# Patient Record
Sex: Female | Born: 1980 | Race: White | Hispanic: No | Smoking: Never smoker
Health system: Southern US, Community
[De-identification: ages and names within clinical notes are randomized; demographics above are authoritative.]

## PROBLEM LIST (undated history)

## (undated) DIAGNOSIS — F32A Depression, unspecified: Secondary | ICD-10-CM

## (undated) DIAGNOSIS — F329 Major depressive disorder, single episode, unspecified: Secondary | ICD-10-CM

## (undated) DIAGNOSIS — K219 Gastro-esophageal reflux disease without esophagitis: Secondary | ICD-10-CM

## (undated) DIAGNOSIS — Z801 Family history of malignant neoplasm of trachea, bronchus and lung: Secondary | ICD-10-CM

## (undated) DIAGNOSIS — Z803 Family history of malignant neoplasm of breast: Secondary | ICD-10-CM

## (undated) DIAGNOSIS — R87619 Unspecified abnormal cytological findings in specimens from cervix uteri: Secondary | ICD-10-CM

## (undated) DIAGNOSIS — F419 Anxiety disorder, unspecified: Secondary | ICD-10-CM

## (undated) DIAGNOSIS — Z806 Family history of leukemia: Secondary | ICD-10-CM

## (undated) DIAGNOSIS — Z8042 Family history of malignant neoplasm of prostate: Secondary | ICD-10-CM

## (undated) DIAGNOSIS — Z800 Family history of malignant neoplasm of digestive organs: Secondary | ICD-10-CM

## (undated) DIAGNOSIS — Z8481 Family history of carrier of genetic disease: Secondary | ICD-10-CM

## (undated) HISTORY — DX: Family history of malignant neoplasm of breast: Z80.3

## (undated) HISTORY — DX: Family history of leukemia: Z80.6

## (undated) HISTORY — DX: Depression, unspecified: F32.A

## (undated) HISTORY — DX: Family history of malignant neoplasm of trachea, bronchus and lung: Z80.1

## (undated) HISTORY — DX: Family history of malignant neoplasm of prostate: Z80.42

## (undated) HISTORY — PX: ABDOMINAL HYSTERECTOMY: SHX81

## (undated) HISTORY — DX: Gastro-esophageal reflux disease without esophagitis: K21.9

## (undated) HISTORY — DX: Major depressive disorder, single episode, unspecified: F32.9

## (undated) HISTORY — DX: Family history of malignant neoplasm of digestive organs: Z80.0

## (undated) HISTORY — DX: Unspecified abnormal cytological findings in specimens from cervix uteri: R87.619

## (undated) HISTORY — DX: Family history of carrier of genetic disease: Z84.81

---
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
## 2000-11-05 ENCOUNTER — Other Ambulatory Visit: Admission: RE | Admit: 2000-11-05 | Discharge: 2000-11-05 | Payer: Self-pay | Admitting: Family Medicine

## 2001-04-29 ENCOUNTER — Encounter: Admission: RE | Admit: 2001-04-29 | Discharge: 2001-04-29 | Payer: Self-pay | Admitting: Family Medicine

## 2001-04-29 ENCOUNTER — Encounter: Payer: Self-pay | Admitting: Family Medicine

## 2002-01-25 ENCOUNTER — Other Ambulatory Visit: Admission: RE | Admit: 2002-01-25 | Discharge: 2002-01-25 | Payer: Self-pay | Admitting: Family Medicine

## 2002-11-09 ENCOUNTER — Other Ambulatory Visit: Admission: RE | Admit: 2002-11-09 | Discharge: 2002-11-09 | Payer: Self-pay | Admitting: Obstetrics and Gynecology

## 2003-06-21 ENCOUNTER — Inpatient Hospital Stay (HOSPITAL_COMMUNITY): Admission: AD | Admit: 2003-06-21 | Discharge: 2003-06-24 | Payer: Self-pay | Admitting: Obstetrics and Gynecology

## 2003-06-23 ENCOUNTER — Encounter: Payer: Self-pay | Admitting: Obstetrics and Gynecology

## 2003-09-29 ENCOUNTER — Other Ambulatory Visit: Admission: RE | Admit: 2003-09-29 | Discharge: 2003-09-29 | Payer: Self-pay | Admitting: Obstetrics and Gynecology

## 2004-03-07 ENCOUNTER — Other Ambulatory Visit: Admission: RE | Admit: 2004-03-07 | Discharge: 2004-03-07 | Payer: Self-pay | Admitting: Obstetrics and Gynecology

## 2004-06-26 ENCOUNTER — Other Ambulatory Visit: Admission: RE | Admit: 2004-06-26 | Discharge: 2004-06-26 | Payer: Self-pay | Admitting: Obstetrics and Gynecology

## 2004-08-12 ENCOUNTER — Inpatient Hospital Stay (HOSPITAL_COMMUNITY): Admission: AD | Admit: 2004-08-12 | Discharge: 2004-08-12 | Payer: Self-pay | Admitting: Obstetrics & Gynecology

## 2004-09-26 ENCOUNTER — Other Ambulatory Visit: Admission: RE | Admit: 2004-09-26 | Discharge: 2004-09-26 | Payer: Self-pay | Admitting: Obstetrics and Gynecology

## 2004-10-10 ENCOUNTER — Inpatient Hospital Stay (HOSPITAL_COMMUNITY): Admission: AD | Admit: 2004-10-10 | Discharge: 2004-10-10 | Payer: Self-pay | Admitting: Obstetrics and Gynecology

## 2005-01-17 ENCOUNTER — Inpatient Hospital Stay (HOSPITAL_COMMUNITY): Admission: AD | Admit: 2005-01-17 | Discharge: 2005-01-17 | Payer: Self-pay | Admitting: Obstetrics and Gynecology

## 2005-04-03 ENCOUNTER — Inpatient Hospital Stay (HOSPITAL_COMMUNITY): Admission: AD | Admit: 2005-04-03 | Discharge: 2005-04-05 | Payer: Self-pay | Admitting: Obstetrics and Gynecology

## 2005-05-21 ENCOUNTER — Other Ambulatory Visit: Admission: RE | Admit: 2005-05-21 | Discharge: 2005-05-21 | Payer: Self-pay | Admitting: Obstetrics and Gynecology

## 2005-11-13 ENCOUNTER — Other Ambulatory Visit: Admission: RE | Admit: 2005-11-13 | Discharge: 2005-11-13 | Payer: Self-pay | Admitting: Obstetrics and Gynecology

## 2006-09-23 HISTORY — PX: TUBAL LIGATION: SHX77

## 2007-06-30 ENCOUNTER — Ambulatory Visit (HOSPITAL_COMMUNITY): Admission: RE | Admit: 2007-06-30 | Discharge: 2007-06-30 | Payer: Self-pay | Admitting: Obstetrics and Gynecology

## 2007-07-17 ENCOUNTER — Inpatient Hospital Stay (HOSPITAL_COMMUNITY): Admission: AD | Admit: 2007-07-17 | Discharge: 2007-07-17 | Payer: Self-pay | Admitting: Obstetrics and Gynecology

## 2007-08-27 ENCOUNTER — Inpatient Hospital Stay (HOSPITAL_COMMUNITY): Admission: AD | Admit: 2007-08-27 | Discharge: 2007-08-27 | Payer: Self-pay | Admitting: Obstetrics and Gynecology

## 2007-08-31 ENCOUNTER — Inpatient Hospital Stay (HOSPITAL_COMMUNITY): Admission: AD | Admit: 2007-08-31 | Discharge: 2007-09-03 | Payer: Self-pay | Admitting: Obstetrics and Gynecology

## 2007-09-01 ENCOUNTER — Encounter (INDEPENDENT_AMBULATORY_CARE_PROVIDER_SITE_OTHER): Payer: Self-pay | Admitting: Obstetrics and Gynecology

## 2007-09-24 LAB — CONVERTED CEMR LAB: Pap Smear: NORMAL

## 2008-04-15 ENCOUNTER — Ambulatory Visit: Payer: Self-pay | Admitting: Family Medicine

## 2008-04-15 ENCOUNTER — Telehealth: Payer: Self-pay | Admitting: Family Medicine

## 2008-04-15 DIAGNOSIS — J45909 Unspecified asthma, uncomplicated: Secondary | ICD-10-CM | POA: Insufficient documentation

## 2008-04-15 DIAGNOSIS — G43009 Migraine without aura, not intractable, without status migrainosus: Secondary | ICD-10-CM | POA: Insufficient documentation

## 2008-04-15 DIAGNOSIS — F329 Major depressive disorder, single episode, unspecified: Secondary | ICD-10-CM

## 2008-04-22 ENCOUNTER — Ambulatory Visit: Payer: Self-pay | Admitting: Family Medicine

## 2008-04-22 DIAGNOSIS — H6590 Unspecified nonsuppurative otitis media, unspecified ear: Secondary | ICD-10-CM | POA: Insufficient documentation

## 2008-06-22 ENCOUNTER — Ambulatory Visit: Payer: Self-pay | Admitting: Family Medicine

## 2009-03-13 ENCOUNTER — Ambulatory Visit: Payer: Self-pay | Admitting: Family Medicine

## 2009-03-23 ENCOUNTER — Ambulatory Visit: Payer: Self-pay | Admitting: Family Medicine

## 2009-03-23 DIAGNOSIS — R5383 Other fatigue: Secondary | ICD-10-CM

## 2009-03-23 DIAGNOSIS — R5381 Other malaise: Secondary | ICD-10-CM

## 2009-04-03 ENCOUNTER — Ambulatory Visit: Payer: Self-pay | Admitting: Family Medicine

## 2009-04-05 LAB — CONVERTED CEMR LAB
ALT: 15 units/L (ref 0–35)
AST: 15 units/L (ref 0–37)
Albumin: 4.4 g/dL (ref 3.5–5.2)
BUN: 15 mg/dL (ref 6–23)
Basophils Absolute: 0 10*3/uL (ref 0.0–0.1)
CO2: 29 meq/L (ref 19–32)
Chloride: 107 meq/L (ref 96–112)
Cholesterol: 193 mg/dL (ref 0–200)
Eosinophils Relative: 2.1 % (ref 0.0–5.0)
Glucose, Bld: 82 mg/dL (ref 70–99)
INR: 1 (ref 0.8–1.0)
Monocytes Relative: 7.4 % (ref 3.0–12.0)
Neutrophils Relative %: 56.2 % (ref 43.0–77.0)
Platelets: 230 10*3/uL (ref 150.0–400.0)
Potassium: 4.6 meq/L (ref 3.5–5.1)
RDW: 12.1 % (ref 11.5–14.6)
TSH: 0.95 microintl units/mL (ref 0.35–5.50)
Total Protein: 7.5 g/dL (ref 6.0–8.3)
VLDL: 14 mg/dL (ref 0.0–40.0)
Vitamin B-12: 269 pg/mL (ref 211–911)
WBC: 5.5 10*3/uL (ref 4.5–10.5)
aPTT: 31.4 s — ABNORMAL HIGH (ref 21.7–28.8)

## 2009-05-17 ENCOUNTER — Ambulatory Visit: Payer: Self-pay | Admitting: Family Medicine

## 2009-05-17 DIAGNOSIS — M533 Sacrococcygeal disorders, not elsewhere classified: Secondary | ICD-10-CM | POA: Insufficient documentation

## 2009-05-17 DIAGNOSIS — M76899 Other specified enthesopathies of unspecified lower limb, excluding foot: Secondary | ICD-10-CM | POA: Insufficient documentation

## 2009-05-17 DIAGNOSIS — M545 Low back pain: Secondary | ICD-10-CM

## 2009-06-08 ENCOUNTER — Telehealth: Payer: Self-pay | Admitting: Family Medicine

## 2009-07-25 ENCOUNTER — Telehealth: Payer: Self-pay | Admitting: Family Medicine

## 2009-07-27 ENCOUNTER — Ambulatory Visit: Payer: Self-pay | Admitting: Family Medicine

## 2009-07-27 ENCOUNTER — Emergency Department: Payer: Self-pay | Admitting: Emergency Medicine

## 2009-12-19 ENCOUNTER — Ambulatory Visit: Payer: Self-pay | Admitting: Gastroenterology

## 2010-01-18 ENCOUNTER — Ambulatory Visit: Payer: Self-pay | Admitting: Oncology

## 2010-03-29 ENCOUNTER — Ambulatory Visit: Payer: Self-pay | Admitting: Oncology

## 2011-02-05 NOTE — Op Note (Signed)
Karen Kramer, Karen Kramer              ACCOUNT NO.:  [id number]   MEDICAL RECORD NO.:  [id number]          PATIENT TYPE:  INP   LOCATION:  9115                          FACILITY:  [facility]   PHYSICIAN:  Guy Sandifer. Henderson Cloud, M.D. DATE OF BIRTH:  [birth year]   DATE OF PROCEDURE:  09/01/2007  DATE OF DISCHARGE:                               OPERATIVE REPORT   PREOPERATIVE DIAGNOSIS:  Desires permanent sterilization.   POSTOPERATIVE DIAGNOSIS:  Desires permanent sterilization.   PROCEDURE:  Postpartum bilateral tubal ligation.   SURGEON:  Guy Sandifer. Henderson Cloud, M.D.   ANESTHESIA:  General with endotracheal intubation.   ESTIMATED BLOOD LOSS:  Drops.   INDICATIONS AND CONSENT:  This patient is a 30 year old married, white  female G2 P2 status post delivery on August 31, 2007.  She desires  permanent sterilization.  Preoperatively the procedure is discussed.  The permanence is stressed to the patient.  The failure rate, and  increased ectopic risks were reviewed.  The potential risks are reviewed  including, but not limited to infection, organ damage, bleeding  requiring transfusion of blood products, possible HIV, and hepatitis  acquisition, DVT, PE, and pneumonia.  All questions were answered, and  consent is signed on the chart.   DESCRIPTION OF PROCEDURE:  The patient was taken to the operating room  where she was identified and placed in the  dorsal supine position, and  general anesthesia was induced given endotracheal intubation.  She was  then prepped and draped in a sterile fashion.  An infraumbilical  incision was made and dissection was carried out in layers to the  peritoneum which was bluntly entered.   The left fallopian tube has been identified from cornu to fimbria.  It  is grasped in its midportion with the Babcock clamped.  A knuckle of the  tube is doubly ligated with two free ties of #0 plain suture.  The  intervening knuckle of tissue was sharply resected.  Cautery was used  to  help assure complete hemostasis.  Inspection reveals no bleeding.   The similar procedure was then carried out on the right fallopian tube.  Again, good hemostasis was noted.  The fascia was closed in a running  fashion with #0 Vicryl suture.  The skin was closed with a subcuticular  4-0 Vicryl suture.  Dermabond was applied.  All counts were correct.  The patient was awakened, taken to the recovery room in stable  condition.     Guy Sandifer Henderson Cloud, M.D.  Electronically Signed    JET/MEDQ  D:  09/01/2007  T:  09/01/2007  Job:  [id number]

## 2011-02-08 NOTE — Discharge Summary (Signed)
   NAMEJENAVI, Karen Kramer                          ACCOUNT NO.:  [id number]   MEDICAL RECORD NO.:  [id number]                   PATIENT TYPE:  INP   LOCATION:  9326                                 [facility]ACILITY:  [facility]   PHYSICIAN:  Michelle L. Vincente Poli, M.D.            DATE OF BIRTH:  11/04/1980   DATE OF ADMISSION:  06/21/2003  DATE OF DISCHARGE:  06/24/2003                                 DISCHARGE SUMMARY   ADMISSION DIAGNOSIS:  Pelvic inflammatory disease.   DISCHARGE DIAGNOSIS:  Pelvic inflammatory disease.   HOSPITAL COURSE:  The patient is a 30 year old female who was admitted with  lower abdominal pain and yellow discharge.  She was treated with two courses  of outpatient antibiotics without relief.  On admission, the patient was  noted to have a normal white blood cell count of 6.1, and a hemoglobin of  13.3.  She was placed on ampicillin, gentamicin, and clindamycin, and by  hospital day #4, her pain is almost resolved, and she is feeling much  better.  She is discharged home.   DISCHARGE MEDICATIONS:  1. Doxycycline 100 mg b.i.d. for 14 days.  2. Ibuprofen.   DISCHARGE INSTRUCTIONS:  She is advised to call if she has any temperature  greater than 100.5, any severe abdominal pain, worsening discharge, or heavy  vaginal bleeding.   FOLLOW UP:  She will follow up in the office in three weeks.                                               Michelle L. Vincente Poli, M.D.    Karen Kramer  D:  07/12/2003  T:  07/12/2003  Job:  [id number]

## 2011-05-20 ENCOUNTER — Emergency Department: Payer: Self-pay | Admitting: Emergency Medicine

## 2011-05-22 ENCOUNTER — Encounter: Payer: Self-pay | Admitting: Family Medicine

## 2011-05-23 ENCOUNTER — Other Ambulatory Visit: Payer: Self-pay | Admitting: Obstetrics and Gynecology

## 2011-05-24 ENCOUNTER — Ambulatory Visit: Payer: Self-pay | Admitting: Family Medicine

## 2011-05-28 ENCOUNTER — Ambulatory Visit (INDEPENDENT_AMBULATORY_CARE_PROVIDER_SITE_OTHER): Payer: BC Managed Care – PPO | Admitting: Family Medicine

## 2011-05-28 ENCOUNTER — Encounter: Payer: Self-pay | Admitting: Family Medicine

## 2011-05-28 ENCOUNTER — Encounter: Payer: Self-pay | Admitting: Neurology

## 2011-05-28 DIAGNOSIS — R202 Paresthesia of skin: Secondary | ICD-10-CM | POA: Insufficient documentation

## 2011-05-28 DIAGNOSIS — R209 Unspecified disturbances of skin sensation: Secondary | ICD-10-CM

## 2011-05-28 DIAGNOSIS — R51 Headache: Secondary | ICD-10-CM

## 2011-05-28 DIAGNOSIS — R55 Syncope and collapse: Secondary | ICD-10-CM

## 2011-05-28 DIAGNOSIS — R519 Headache, unspecified: Secondary | ICD-10-CM | POA: Insufficient documentation

## 2011-05-28 NOTE — Assessment & Plan Note (Signed)
Assoc with syncope and poss seizure  Not her regular migraine - ? Atypical  Nl lab and CT from [facility]

## 2011-05-28 NOTE — Assessment & Plan Note (Signed)
Intermittent all over body with loc and poss seizure on sat  Nl w/u ER [facility]  Worse on L today with some pronator drift Ref to neurology

## 2011-05-28 NOTE — Assessment & Plan Note (Signed)
Assoc with prodrome of tingling all over  Went to [facility] - nl ct and labs  Ref to neurol  Has fam hx of seizure disorder inst not to drive Feels ok today Some L pronator drift on exam however

## 2011-05-28 NOTE — Progress Notes (Signed)
Subjective:    Patient ID: Karen Kramer, female    DOB: 1981-05-06, 30 y.o.   MRN: [id number]  HPI Here for tingling all over her body Symptoms started last Saturday and have been on and off  Felt like warm tingly sensations from her body and then got light headed and fell to the floor Lost consciousness 15-20 seconds  Never happened before  Friend noticed seizure activity - did not tell her what she did  No new medicines   Went to [facility] on Monday- and CT was normal , and ekg was normal  ? If they drew her blood    No hx of seizure disorder in the past --but has a family hx   Same symptoms Sunday and yesterday  Could lie down when she felt dizzy - so she did not pass out  Headache started this last week Is all over her head -- pounding -- light and heavy feeling  Does not feel like her regular migraine   Right now - just her L arm and back of neck  Not anxious  Stress is the same as usual   Lost 14 lb from last visit-- is trying hard to loose weight  Eating less- cutting portions in 1/2- not extreme about it  Not exercising  Eating does not resolve symptoms   Patient Active Problem List  Diagnoses  . DEPRESSION  . MIGRAINE, COMMON  . ASTHMA, PERSISTENT, MODERATE  . LOW BACK PAIN, CHRONIC  . SACROILIAC JOINT DYSFUNCTION  . TROCHANTERIC BURSITIS, RIGHT  . FATIGUE  . Tingling of skin  . Headache  . Syncope   Past Medical History  Diagnosis Date  . Asthma   . Depression   . Atypical cervical glandular cells    Past Surgical History  Procedure Date  . Tubal ligation 2008   History  Substance Use Topics  . Smoking status: Never Smoker   . Smokeless tobacco: Not on file  . Alcohol Use: No   Family History  Problem Relation Age of Onset  . Cancer Mother     pancreatic  . Lung disease Father   . Cancer Maternal Grandfather     prostate  . Cancer Paternal Grandfather     prostate  . Seizures Father 35   No Known Allergies Current Outpatient  Prescriptions on File Prior to Visit  Medication Sig Dispense Refill  . citalopram (CELEXA) 40 MG tablet Take 40 mg by mouth daily.        . diclofenac (VOLTAREN) 75 MG EC tablet Take 75 mg by mouth 2 (two) times daily.        . mometasone (NASONEX) 50 MCG/ACT nasal spray Place 2 sprays into the nose daily.        . Multiple Vitamin (MULTIVITAMIN) tablet Take 1 tablet by mouth daily.              Review of Systems Review of Systems  Constitutional: Negative for fever, appetite change, fatigue and unexpected weight change.  Eyes: Negative for pain and visual disturbance.  Respiratory: Negative for cough and shortness of breath.   Cardiovascular: Negative for cp or palpitations    Gastrointestinal: Negative for nausea, diarrhea and constipation.  Genitourinary: Negative for urgency and frequency.  Skin: Negative for pallor or rash   Neurological: Negative for weakness, , tremor, ataxia , pos for headache/ paresthesia  Hematological: Negative for adenopathy. Does not bruise/bleed easily.  Psychiatric/Behavioral: Negative for dysphoric mood. The patient is not nervous/anxious.  Objective:   Physical Exam  Constitutional: She is oriented to person, place, and time. She appears well-developed and well-nourished. No distress.  HENT:  Head: Normocephalic and atraumatic.  Right Ear: External ear normal.  Left Ear: External ear normal.  Nose: Nose normal.  Mouth/Throat: Oropharynx is clear and moist.       No sinus tenderness No temporal tenderness  Eyes: Conjunctivae and EOM are normal. Pupils are equal, round, and reactive to light.  Neck: Normal range of motion. Neck supple. No JVD present. Carotid bruit is not present. No thyromegaly present.  Cardiovascular: Normal rate, regular rhythm, normal heart sounds and intact distal pulses.   Pulmonary/Chest: Effort normal and breath sounds normal. No respiratory distress. She exhibits no tenderness.  Abdominal: Soft. Bowel sounds  are normal. She exhibits no distension and no mass. There is no tenderness.  Musculoskeletal: Normal range of motion. She exhibits no edema and no tenderness.  Lymphadenopathy:    She has no cervical adenopathy.  Neurological: She is alert and oriented to person, place, and time. She has normal strength and normal reflexes. She displays no atrophy and no tremor. A sensory deficit is present. No cranial nerve deficit. She exhibits normal muscle tone. She displays a negative Romberg sign. She displays no seizure activity. Coordination and gait normal.       Mild pronator drift noted on the left  Both hands - has decreased sensation on fingers and forearms (with monofilament) Normal temp and proprioception   Skin: Skin is warm and dry.  Psychiatric: She has a normal mood and affect.          Assessment & Plan:

## 2011-05-28 NOTE — Patient Instructions (Signed)
  Refer to neurology asap please  I do not want you to drive until you get checked out  Stay out of work until further evaluation please  Eat regular meals and drink fluids

## 2011-05-29 ENCOUNTER — Encounter: Payer: Self-pay | Admitting: Neurology

## 2011-05-29 ENCOUNTER — Ambulatory Visit (INDEPENDENT_AMBULATORY_CARE_PROVIDER_SITE_OTHER): Payer: BC Managed Care – PPO | Admitting: Neurology

## 2011-05-29 DIAGNOSIS — G43909 Migraine, unspecified, not intractable, without status migrainosus: Secondary | ICD-10-CM

## 2011-05-29 DIAGNOSIS — G43109 Migraine with aura, not intractable, without status migrainosus: Secondary | ICD-10-CM

## 2011-05-29 NOTE — Progress Notes (Signed)
Dr. Ermalene Searing,  Thank you for having me see Karen Kramer in consultation today for her problems with recent spell of loss of consciousness.  As you may recall she is a 30 year old right hand dominant woman with a history of migraine headaches who approximately 2 weeks ago began to develop a headache and felt faint. Associated with this was a feeling of warm tingling. She did not feel diaphoretic. She was noted to fall and was out for approximately 15-20 seconds. She may have had some shaking movements during this time period. It took her several minutes to get back to normal. She did not have any bowel or bladder loss or tongue biting.  Afterwards she had significant headache. Since that time she's been having frequent headaches almost every day. These are described as having a "funny feeling" in the head. At times they are throbbing. She is also having bilateral numbness and tingling at times. Many times this seems to evolve into just involving the left side near the end of headache.  Has not been taking any medication for these headaches. She says she feels weak on the left side as well.  Past medical history significant for migraine headaches since age 10. She  describes them as bilateral throbbing in nature sometimes with accompanying nausea. They occur about once per month but do not clearly have a relationship to her period.  She has had scotoma with headaches but no other neurologic symptoms. She also suffers from depression and anxiety. She also has chronic hip bursitis.  Social history: she is a single mother. 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 She has 2 children.  Family history: Both her brother and sister have migraine headaches. This is the same for mother and father.  Review of systems: 14 systems were reviewed and otherwise unremarkable problems with anxiety and depression. However this has not changed recently.  Exam: Filed Vitals:   05/29/11 1128  BP: 108/72  Pulse:  76   Filed Vitals:   05/29/11 1128  Height: 5' 3.5" (1.613 m)  Weight: 128 lb (58.06 kg)   General she is a well-appearing young woman in no acute distress. She is oriented x4. She is appropriate affect. He is neither  circumferential or tangential. Fundi are benign with spontaneous venous pulsations bilaterally. Cardiovascular history reveals no carotid bruits, regular rate and rhythm.  Neurologically pupils are round and react to light visual fields full to confrontation. Extraocular movements without nystagmus and her full. Facial sensation decreased on the left face. Muscles of facial expression are symmetric. Tongue uvula and palate midline. Hearing is bilaterally intact. Shoulder shrug bilaterally intact. Jaw strength and bulk is normal.  Motor exam reveals normal bulk tone. She is a functional pronator drift on the left. There is no orbiting or impairment of fine finger movements however. She has functional giveaway weakness on the left.  Reflexes are symmetric and toes downgoing. Sensory testing reveals decreased temperature and vibration sense on the left hemibody.  Coordination reveals normal finger to nose testing. Casual gait and station are normal. Romberg is negative. Tandem gait is intact.  Recent CT of the head was reviewed by myself and was unremarkable.  Impression: Karen Kramer is a 30 year old female with history of migraine headaches does have 1 loss of consciousness in the setting of a new type of headache. These headaches have continued almost daily since the initial event 2 weeks ago. My feeling is likely that a loss of consciousness was syncopal in nature  and likely a vasovagal response associated with the headache or possibly a basilar variant of migraine.  It is very common in syncopal events to have short motor convulsions.  I also think that her current waxing and waning  sensory symptoms are likely a prolonged aura associated with her ongoing headaches.  She also  has functional overlay which may be due to her difficulties with anxiety.     However I am going to proceed with getting an MRI of the brain with and without contrast. Given the new nature the headache it is possible that this represents a more serious process. However I do think this is unlikely. I am also going to get an MRA of her head in case there is evidence of vasoconstriction.  I informed her that it is okay to return to work. She unfortunately should not drive. She'll need to go at least several months spell free before she gets back behind the wheel.  She is to avoid taking baths as well as climbing heights.  I will see her back in clinic after her imaging. I instructed her to take naproxen 400 mg twice a day to see if we compared his recent exacerbation of her headaches. If this does not work I would consider a steroid taper.  Thank you for having me see this nice woman in consultation.  If you have any questions please feel free to contact me.  Lupita Raider Modesto Charon, MD [facility], [place]    - couple weekends ago - funny headache feel - felt faint - 15 - 20 seconds - few minutes, muffled - warm tingling feeling - headache - tingling in the face - nothing changed - bladder loss - not feeling sweaty - no biting of tongue  - headaches  - inside of the head - numbness in the head - throbbing headache  - bad headache - throbbing, photophobia - bitemporal in past   - stress and axnxiety - hip bursitis  - migraines in family, both brother and sisters, father - flashing lights with your headaches  Social history:    Allergies: NKDA   -  Exam: - normal cranial nerves - functional left sided drift - normal orbiting, Normal fine finger - decreased sensation temperature and vibration on left side.  - mri brain with and without - MRA head

## 2011-05-29 NOTE — Patient Instructions (Addendum)
You have been scheduled for a MRI and MRA on Tuesday, Sept, 11th at 9:00am.  Please arrive by 8:45am to [facility].  Please get Aleve OTC and take two pill twice a day for two weeks.

## 2011-06-04 ENCOUNTER — Inpatient Hospital Stay (HOSPITAL_COMMUNITY): Admission: RE | Admit: 2011-06-04 | Payer: BC Managed Care – PPO | Source: Ambulatory Visit

## 2011-06-05 ENCOUNTER — Other Ambulatory Visit: Payer: Self-pay | Admitting: Obstetrics and Gynecology

## 2011-06-14 ENCOUNTER — Ambulatory Visit: Payer: Self-pay | Admitting: Family Medicine

## 2011-06-14 ENCOUNTER — Encounter: Payer: Self-pay | Admitting: Family Medicine

## 2011-06-21 ENCOUNTER — Ambulatory Visit: Payer: BC Managed Care – PPO | Admitting: Neurology

## 2011-07-01 LAB — CBC
MCHC: 34.8
Platelets: 156
RBC: 3.67 — ABNORMAL LOW
RDW: 15.1
WBC: 14.3 — ABNORMAL HIGH

## 2011-07-01 LAB — URINALYSIS, ROUTINE W REFLEX MICROSCOPIC
Bilirubin Urine: NEGATIVE
Hgb urine dipstick: NEGATIVE
Ketones, ur: 15 — AB
Nitrite: NEGATIVE
pH: 6

## 2011-07-01 LAB — RPR: RPR Ser Ql: NONREACTIVE

## 2011-07-01 LAB — RH IMMUNE GLOB WKUP(>/=20WKS)(NOT WOMEN'S HOSP): Fetal Screen: NEGATIVE

## 2011-07-01 LAB — URINE MICROSCOPIC-ADD ON

## 2011-07-03 LAB — URINALYSIS, ROUTINE W REFLEX MICROSCOPIC
Bilirubin Urine: NEGATIVE
Glucose, UA: NEGATIVE
Hgb urine dipstick: NEGATIVE
Ketones, ur: 40 — AB
Protein, ur: NEGATIVE

## 2011-07-04 LAB — RH IMMUNE GLOBULIN WORKUP (NOT WOMEN'S HOSP)

## 2011-07-04 LAB — GLUCOSE TOLERANCE, 1 HOUR: Glucose, 1 Hour GTT: 132

## 2011-10-21 ENCOUNTER — Ambulatory Visit: Payer: BC Managed Care – PPO | Admitting: Family Medicine

## 2011-10-25 ENCOUNTER — Ambulatory Visit: Payer: BC Managed Care – PPO | Admitting: Family Medicine

## 2011-11-20 ENCOUNTER — Ambulatory Visit: Payer: BC Managed Care – PPO | Admitting: Family Medicine

## 2011-11-22 ENCOUNTER — Ambulatory Visit (INDEPENDENT_AMBULATORY_CARE_PROVIDER_SITE_OTHER): Payer: BC Managed Care – PPO | Admitting: Family Medicine

## 2011-11-22 ENCOUNTER — Encounter: Payer: Self-pay | Admitting: Family Medicine

## 2011-11-22 VITALS — BP 100/60 | HR 76 | Temp 98.7°F | Ht 63.5 in | Wt 139.2 lb

## 2011-11-22 DIAGNOSIS — M25562 Pain in left knee: Secondary | ICD-10-CM

## 2011-11-22 DIAGNOSIS — M25569 Pain in unspecified knee: Secondary | ICD-10-CM

## 2011-11-22 DIAGNOSIS — M76899 Other specified enthesopathies of unspecified lower limb, excluding foot: Secondary | ICD-10-CM

## 2011-11-22 MED ORDER — HYDROCODONE-ACETAMINOPHEN 5-500 MG PO TABS: 1.0000 | ORAL_TABLET | Freq: Three times a day (TID) | ORAL | Status: AC | PRN
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
## 2011-11-22 NOTE — Assessment & Plan Note (Signed)
Intermittent for a while - ? 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

## 2011-11-22 NOTE — Patient Instructions (Signed)
I think you may have an overuse issue in knee - ? Tendonitis , less likely arthritis  I think you have bursitis in hip Since you cannot take nsaids - use tylenol for pain If pain is severe- try the vicodin- but do not mix with tylenol  Use intermittent ice/ heat on hip (10 min on and 10 min off) Make appt with Dr Patsy Lager at check out  Xray is not available today

## 2011-11-22 NOTE — Assessment & Plan Note (Signed)
This is recurrent - and in past did not fully respond to nsaid - and now cannot take them  I suspect she may need an injection  Will ref for eval by Dr Patsy Lager Has vicodin for severe pain only

## 2011-12-09 ENCOUNTER — Encounter: Payer: Self-pay | Admitting: Family Medicine

## 2011-12-09 ENCOUNTER — Ambulatory Visit (INDEPENDENT_AMBULATORY_CARE_PROVIDER_SITE_OTHER): Payer: BC Managed Care – PPO | Admitting: Family Medicine

## 2011-12-09 VITALS — BP 110/72 | HR 70 | Temp 98.7°F | Ht 63.5 in | Wt 140.8 lb

## 2011-12-09 DIAGNOSIS — M7650 Patellar tendinitis, unspecified knee: Secondary | ICD-10-CM

## 2011-12-09 DIAGNOSIS — M7652 Patellar tendinitis, left knee: Secondary | ICD-10-CM

## 2011-12-09 DIAGNOSIS — M7061 Trochanteric bursitis, right hip: Secondary | ICD-10-CM

## 2011-12-09 DIAGNOSIS — M76899 Other specified enthesopathies of unspecified lower limb, excluding foot: Secondary | ICD-10-CM

## 2011-12-09 NOTE — Progress Notes (Signed)
Patient Name: Karen Kramer Date of Birth: 1981-09-13 Age: 31 y.o. Medical Record Number: [id number] Gender: female Date of Encounter: 12/09/2011  History of Present Illness:  Karen Kramer is a 31 y.o. very pleasant female patient who presents with the following:  R hip bursitis has flared up and not able to really sit up and it will bother her and will start to limp Fair pleasant patient who has had some right trochanteric bursitis on the right without any prior trauma or injury. The pain is almost all lateral leg directly at the GTB. She denies any groin pain. She has no significant back pain. No radicular pain. No numbness or tingling. 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 She has not done any home rehabilitation and, does not have any sort of routine activity that she takes part in.  But now has some pain L knee pain. Localized over the patellar tendon. No trauma or accident. No bruising or swelling. No history of internal derangement. She does have a history of a remote injury that she can recall as a child. She wore some sort of leg immobilizer, but is unclear the exact injury. She denies an occult fracture. No locking up. No giving way. Anterior and posterior.  Works at a daycare, infant.  Past Medical History, Surgical History, Social History, Family History, Problem List, Medications, and Allergies have been reviewed and updated if relevant.  Review of Systems:  GEN: No fevers, chills. Nontoxic. Primarily MSK c/o today. MSK: Detailed in the HPI GI: tolerating PO intake without difficulty Neuro: No numbness, parasthesias, or tingling associated. Otherwise the pertinent positives of the ROS are noted above.    Physical Examination: Filed Vitals:   12/09/11 1144  BP: 110/72  Pulse: 70  Temp: 98.7 F (37.1 C)  TempSrc: Oral  Height: 5' 3.5" (1.613 m)  Weight: 140 lb 12.8 oz (63.866 kg)  SpO2: 99%    Body mass index is  24.55 kg/(m^2).   GEN: WDWN, NAD, Non-toxic, Alert & Oriented x 3 HEENT: Atraumatic, Normocephalic.  Ears and Nose: No external deformity. EXTR: No clubbing/cyanosis/edema NEURO: Normal gait.  PSYCH: Normally interactive. Conversant. Not depressed or anxious appearing.  Calm demeanor.   HIP EXAM: SIDE: R ROM: Abduction, Flexion, Internal and External range of motion: full Pain with terminal IROM and EROM: no GTB: marked TTP SLR: NEG Knees: No effusion FABER: NT REVERSE FABER: NT, neg Piriformis: NT at direct palpation Str: flexion: 5/5 abduction: 5/5 adduction: 4-/5  Left knee: Full extension. Full flexion. No effusion. Minimal tender with loading the patellar facets. Stable to varus and valgus stress. Negative Lachman. Negative but Murray's. Negative bounce home maneuver. Notably tender at the patellar tendon. Mild posterior tenderness and fullness.    Assessment and Plan:  1. Trochanteric bursitis of right hip   2. Patellar tendinitis of left knee     I reviewed with the patient the structures involved and how they related to diagnosis.   Patient was given a rehabilitation protocol emphasizing core rehab, partcularly addressing abductor weakness, and stretching of the pelvic musculature, hips, and ITB.  Trochanteric bursa injections have clearly been shown to help with acute bursitis, and the patient would benefit.  I suspect that the patellar tendinopathy is likely secondary. I reviewed some rehabilitation with her including some acentric lowers. She is also going to use a Cho-pat type strap and I have given her in the office  Trochanteric Bursitis Injection, R Verbal consent  obtained. Risks (including infection, potential atrophy), benefits, and alternatives reviewed. Greater trochanter sterilely prepped with Chloraprep. Ethyl Chloride used for anesthesia. 8 cc of Lidocaine 1% injected with 2 cc of 40 mg Depo-Medrol into trochanteric bursa at area of maximal tenderness at  greater trochanter. Needle taken to bone to troch bursa, flows easily. Bursa massaged. No bleeding and no complications. Decreased pain after injection. Needle: 22 gauge

## 2011-12-09 NOTE — Patient Instructions (Signed)
Trochanteric Bursa Rehab  Stretches: Cross Leg, "sink stretch" - can do all day during work day Cross over stretch, laying down, hold 5-10 sec x 3  Hip Abduction - build to 3 sets of 30 and then add weights begin with no weight. When 3 x 30 reached, Add 2 lb. ankle weight. Increase to 3,4,5,6 when 3x30 achieved.  Knee up and open hip: knee up and externally rotate hip to open position, hold 2 sec and repeat each leg, 30 reps   

## 2012-03-09 ENCOUNTER — Ambulatory Visit: Payer: BC Managed Care – PPO | Admitting: Family Medicine

## 2012-03-23 ENCOUNTER — Encounter: Payer: Self-pay | Admitting: Family Medicine

## 2012-03-23 ENCOUNTER — Encounter: Payer: BC Managed Care – PPO | Admitting: Family Medicine

## 2012-03-23 DIAGNOSIS — Z0289 Encounter for other administrative examinations: Secondary | ICD-10-CM

## 2012-03-30 NOTE — Progress Notes (Signed)
This encounter was created in error - please disregard.

## 2012-03-31 ENCOUNTER — Encounter: Payer: Self-pay | Admitting: Family Medicine

## 2012-03-31 ENCOUNTER — Ambulatory Visit (INDEPENDENT_AMBULATORY_CARE_PROVIDER_SITE_OTHER): Payer: BC Managed Care – PPO | Admitting: Family Medicine

## 2012-03-31 VITALS — BP 126/78 | HR 71 | Temp 98.1°F | Ht 62.5 in | Wt 136.8 lb

## 2012-03-31 DIAGNOSIS — N390 Urinary tract infection, site not specified: Secondary | ICD-10-CM

## 2012-03-31 LAB — POCT URINALYSIS DIPSTICK
Ketones, UA: NEGATIVE
Protein, UA: NEGATIVE
pH, UA: 6.5

## 2012-03-31 MED ORDER — CIPROFLOXACIN HCL 500 MG PO TABS: 500.0000 mg | ORAL_TABLET | Freq: Two times a day (BID) | ORAL | Status: AC

## 2012-03-31 NOTE — Assessment & Plan Note (Signed)
With some L flank pain and cvs tenderness after a week  tx with cipro 500 bid for 7 d Disc red flags to watch for (Update if not starting to improve in several days or if worsening  ) Urine sent for cx

## 2012-03-31 NOTE — Progress Notes (Signed)
Subjective:    Patient ID: Karen Kramer, female    DOB: November 11, 1980, 31 y.o.   MRN: [id number]  HPI Here for uti symptoms  Started about 2 weeks ago  All of last week took azo pills and drank cranberry juice -did not help much at all  Started with some blood in her urine  Pain over bladder and also upon urination  Very uncomfortable  Frequency and urgency  Last few days pain in L flank/ side No fever Felt nausea yesterday   Had hyst -not pregnant   Patient Active Problem List  Diagnosis  . DEPRESSION  . MIGRAINE, COMMON  . ASTHMA, PERSISTENT, MODERATE  . LOW BACK PAIN, CHRONIC  . SACROILIAC JOINT DYSFUNCTION  . TROCHANTERIC BURSITIS, RIGHT  . FATIGUE  . Tingling of skin  . Headache  . Syncope  . Left knee pain  . Complicated UTI (urinary tract infection)   Past Medical History  Diagnosis Date  . Asthma   . Depression   . Atypical cervical glandular cells    Past Surgical History  Procedure Date  . Tubal ligation 2008   History  Substance Use Topics  . Smoking status: Never Smoker   . Smokeless tobacco: Not on file  . Alcohol Use: Yes     occassionally   Family History  Problem Relation Age of Onset  . Cancer Mother     pancreatic  . Lung disease Father   . Cancer Maternal Grandfather     prostate  . Cancer Paternal Grandfather     prostate  . Seizures Father 35   No Known Allergies Current Outpatient Prescriptions on File Prior to Visit  Medication Sig Dispense Refill  . citalopram (CELEXA) 40 MG tablet Take 40 mg by mouth daily.        . clonazePAM (KLONOPIN) 1 MG tablet Take 1 mg by mouth every 8 (eight) hours as needed.              Review of Systems Review of Systems  Constitutional: Negative for fever, appetite change,  and unexpected weight change. pos for fatigue  Eyes: Negative for pain and visual disturbance.  Respiratory: Negative for cough and shortness of breath.   Cardiovascular: Negative for cp or palpitations      Gastrointestinal: Negative for nausea, diarrhea and constipation.  Genitourinary: pos  for urgency and frequency. also flank pain  Skin: Negative for pallor or rash   Neurological: Negative for weakness, light-headedness, numbness and headaches.  Hematological: Negative for adenopathy. Does not bruise/bleed easily.  Psychiatric/Behavioral: Negative for dysphoric mood. The patient is not nervous/anxious.         Objective:   Physical Exam  Constitutional: She appears well-developed and well-nourished. No distress.  HENT:  Head: Normocephalic and atraumatic.  Mouth/Throat: Oropharynx is clear and moist.  Eyes: Conjunctivae and EOM are normal. Pupils are equal, round, and reactive to light.  Neck: Normal range of motion. Neck supple.  Cardiovascular: Normal rate and regular rhythm.   Pulmonary/Chest: Effort normal and breath sounds normal.  Abdominal: Soft. Bowel sounds are normal. She exhibits no distension and no mass. There is tenderness. There is no rebound and no guarding.       Suprapubic tenderness LLQ and flank tenderness - mild L cva tenderness No rebound/ gaurding or skin change   Musculoskeletal: She exhibits tenderness. She exhibits no edema.       Mild L cva tenderness  Lymphadenopathy:    She has no cervical adenopathy.  Neurological:  She is alert.  Skin: Skin is warm and dry. No rash noted. No erythema. No pallor.  Psychiatric: She has a normal mood and affect.          Assessment & Plan:

## 2012-03-31 NOTE — Patient Instructions (Addendum)
Drink lots of water  Take cipro as directed We will update you with culture results when they return Update if not starting to improve in 2-3 days or if worsening

## 2012-04-03 ENCOUNTER — Telehealth: Payer: Self-pay

## 2012-04-03 NOTE — Telephone Encounter (Signed)
pt started Cipro on 03/31/12; left side and back pain is the same, slightly less pain when urinating, nausea but no fever. Pt wanted to update Dr Milinda Antis and to get urine culture results when available. [facility].Please advise.

## 2012-04-03 NOTE — Telephone Encounter (Signed)
ucx final came back today-and her uti is sensitive to cipro-- so that is good , if symptoms are worse in the am - call this number to get appt at Saturday [facility] (or if later in weekend call or go to [facility]) If not significantly improved Monday-call to see me please  Keep drinking water

## 2012-04-06 NOTE — Telephone Encounter (Signed)
Spoke with patient she is doing a lot better since her last appointment, advised that if she doesn't get better or having mor symptoms to make an appointment.

## 2012-06-11 DIAGNOSIS — R609 Edema, unspecified: Secondary | ICD-10-CM | POA: Insufficient documentation

## 2013-10-19 ENCOUNTER — Ambulatory Visit: Payer: Self-pay | Admitting: Family Medicine

## 2015-05-12 ENCOUNTER — Ambulatory Visit (INDEPENDENT_AMBULATORY_CARE_PROVIDER_SITE_OTHER): Payer: Medicaid Other | Admitting: Family Medicine

## 2015-05-12 ENCOUNTER — Encounter: Payer: Self-pay | Admitting: Family Medicine

## 2015-05-12 VITALS — BP 112/78 | HR 83 | Temp 98.3°F | Resp 16 | Ht 62.0 in | Wt 145.2 lb

## 2015-05-12 DIAGNOSIS — F418 Other specified anxiety disorders: Secondary | ICD-10-CM | POA: Diagnosis not present

## 2015-05-12 DIAGNOSIS — F329 Major depressive disorder, single episode, unspecified: Secondary | ICD-10-CM

## 2015-05-12 DIAGNOSIS — F32A Depression, unspecified: Secondary | ICD-10-CM

## 2015-05-12 DIAGNOSIS — F419 Anxiety disorder, unspecified: Principal | ICD-10-CM

## 2015-05-12 DIAGNOSIS — G4700 Insomnia, unspecified: Secondary | ICD-10-CM | POA: Diagnosis not present

## 2015-05-12 MED ORDER — DULOXETINE HCL 60 MG PO CPEP: 60.0000 mg | ORAL_CAPSULE | Freq: Every day | ORAL | Status: DC

## 2015-05-12 MED ORDER — DULOXETINE HCL 30 MG PO CPEP: 30.0000 mg | ORAL_CAPSULE | Freq: Every day | ORAL | Status: DC

## 2015-05-12 MED ORDER — BUPROPION HCL ER (SR) 150 MG PO TB12: 150.0000 mg | ORAL_TABLET | Freq: Two times a day (BID) | ORAL | Status: DC

## 2015-05-12 NOTE — Patient Instructions (Signed)
Let's try increasing Cymbalta to 90mg  daily.  You can add (1) 30 mg capsule to 60 mg capsules to see if this improves your anxiety.   Sleep Hygiene: Try pushing bedtime back to 9pm. Stay in the bedroom only about 8 hours maximum.  Make your to-do list in any other room but your bedroom. Bedroom is for sleep and sex only. Remove kindles, tablet, all electronics from the bedroom.   When you are awake at night- do not look at your phone. Try progressive muscle relaxation.  You can take a unisom OTC to help you sleep.

## 2015-05-12 NOTE — Progress Notes (Signed)
Subjective:    Patient ID: Karen Kramer, female    DOB: Oct 30, 1980, 34 y.o.   MRN: [id number]  HPI: Karen Kramer is a 34 y.o. female presenting on 05/12/2015 for Anxiety and Insomnia   HPI  Pt presents for follow-up of depression/anxiety. She was switched to Cymbalta in the spring. She is also taking wellbutrin. Overall going well but pt is reporting trouble sleeping. Sleep disturbances started about 2 mos ago. Frequent early morning awakenings. Goes to bed at 8pm on a daily basis and wakes up 530am.  Takes 10-15 minutes to fall asleep- wakes up 1 hour later- falls back asleep 30 minutes can be 2-3 hours to fall asleep. Thinking about stress during this time.   Past Medical History  Diagnosis Date  . Asthma   . Depression   . Atypical cervical glandular cells     No current outpatient prescriptions on file prior to visit.   No current facility-administered medications on file prior to visit.    Review of Systems  Constitutional: Positive for fatigue.  HENT: Negative.   Respiratory: Negative for chest tightness and shortness of breath.   Cardiovascular: Negative for chest pain, palpitations and leg swelling.  Genitourinary: Negative.   Neurological: Negative for dizziness, syncope, weakness and numbness.  Psychiatric/Behavioral: Positive for sleep disturbance. Negative for suicidal ideas, dysphoric mood and agitation. The patient is nervous/anxious.    Per HPI unless specifically indicated above     Objective:    BP 112/78 mmHg  Pulse 83  Temp(Src) 98.3 F (36.8 C) (Oral)  Resp 16  Ht 5\' 2"  (1.575 m)  Wt 145 lb 3.2 oz (65.862 kg)  BMI 26.55 kg/m2  LMP 05/25/2011  Wt Readings from Last 3 Encounters:  05/12/15 145 lb 3.2 oz (65.862 kg)  03/31/12 136 lb 12 oz (62.029 kg)  12/09/11 140 lb 12.8 oz (63.866 kg)    Physical Exam  Constitutional: She is oriented to person, place, and time. She appears well-developed and well-nourished. No distress.  Cardiovascular:  Normal rate and regular rhythm.  Exam reveals no gallop and no friction rub.   No murmur heard. Pulmonary/Chest: Effort normal and breath sounds normal. She has no wheezes. She exhibits no tenderness.  Neurological: She is alert and oriented to person, place, and time.  Skin: She is not diaphoretic.  Psychiatric: She has a normal mood and affect. Her speech is normal and behavior is normal. Judgment and thought content normal. Cognition and memory are normal.       Assessment & Plan:   Problem List Items Addressed This Visit      Other   Anxiety and depression - Primary    Doing well on Cymbalta but extra stress in decreasing sleep quality. Trial of increasing to 90mg  once daily.  Pt can resume 60 mg if there is no change.       Insomnia    Sleep hygiene reviewed. Recommend general measures to improve sleep quality such as removing electronics.  Can take OTC sleep aid such as unisom if needed. Also try melatonin.  Education on cognitive behavioral therapy for sleep provided.          Meds ordered this encounter  Medications  . DISCONTD: buPROPion (WELLBUTRIN) 75 MG tablet    Sig: TAKE 1 TABLET, ORAL, TWO TIMES DAILY    Refill:  1  . DISCONTD: DULoxetine (CYMBALTA) 30 MG capsule    Sig: TAKE 1 CAPSULE BY MOUTH DAILY FOR 1 WEEK THEN INCREASE TO 2 CAPSULES  AFTER A WEEK    Refill:  7  . fluticasone (FLONASE) 50 MCG/ACT nasal spray    Sig: 2 (TWO) SPRAY, NASAL, TWO TIMES DAILY    Refill:  7  . meloxicam (MOBIC) 7.5 MG tablet    Sig: Take 7.5 mg by mouth daily.    Refill:  2  . DULoxetine (CYMBALTA) 30 MG capsule    Sig: Take 1 capsule (30 mg total) by mouth daily.    Dispense:  90 capsule    Refill:  3    Order Specific Question:  Supervising Provider    Answer:  Arlis Porta [phone]  . DULoxetine (CYMBALTA) 60 MG capsule    Sig: Take 1 capsule (60 mg total) by mouth daily.    Dispense:  90 capsule    Refill:  3    Order Specific Question:  Supervising Provider     Answer:  Arlis Porta [phone]  . buPROPion (WELLBUTRIN SR) 150 MG 12 hr tablet    Sig: Take 1 tablet (150 mg total) by mouth 2 (two) times daily.    Dispense:  90 tablet    Refill:  3    Order Specific Question:  Supervising Provider    Answer:  Arlis Porta [[id number]]      Follow up plan: Return in about 3 months (around 08/12/2015) for Anxiety, depression. Marland Kitchen

## 2015-05-12 NOTE — Assessment & Plan Note (Signed)
Doing well on Cymbalta but extra stress in decreasing sleep quality. Trial of increasing to 90mg  once daily.  Pt can resume 60 mg if there is no change.

## 2015-05-12 NOTE — Assessment & Plan Note (Signed)
Sleep hygiene reviewed. Recommend general measures to improve sleep quality such as removing electronics.  Can take OTC sleep aid such as unisom if needed. Also try melatonin.  Education on cognitive behavioral therapy for sleep provided.

## 2015-08-10 ENCOUNTER — Ambulatory Visit: Payer: Medicaid Other | Admitting: Family Medicine

## 2015-08-11 ENCOUNTER — Ambulatory Visit: Payer: Medicaid Other | Admitting: Family Medicine

## 2015-08-24 ENCOUNTER — Ambulatory Visit (INDEPENDENT_AMBULATORY_CARE_PROVIDER_SITE_OTHER): Payer: Medicaid Other | Admitting: Family Medicine

## 2015-08-24 ENCOUNTER — Encounter: Payer: Self-pay | Admitting: Family Medicine

## 2015-08-24 VITALS — BP 125/86 | HR 90 | Temp 98.1°F | Resp 16 | Ht 62.0 in | Wt 146.0 lb

## 2015-08-24 DIAGNOSIS — H8112 Benign paroxysmal vertigo, left ear: Secondary | ICD-10-CM | POA: Diagnosis not present

## 2015-08-24 DIAGNOSIS — J0101 Acute recurrent maxillary sinusitis: Secondary | ICD-10-CM | POA: Diagnosis not present

## 2015-08-24 DIAGNOSIS — R0789 Other chest pain: Secondary | ICD-10-CM | POA: Diagnosis not present

## 2015-08-24 DIAGNOSIS — Z23 Encounter for immunization: Secondary | ICD-10-CM | POA: Insufficient documentation

## 2015-08-24 DIAGNOSIS — N3946 Mixed incontinence: Secondary | ICD-10-CM | POA: Insufficient documentation

## 2015-08-24 MED ORDER — AMOXICILLIN-POT CLAVULANATE 875-125 MG PO TABS: 1.0000 | ORAL_TABLET | Freq: Two times a day (BID) | ORAL | Status: DC

## 2015-08-24 MED ORDER — MECLIZINE HCL 32 MG PO TABS: 32.0000 mg | ORAL_TABLET | Freq: Three times a day (TID) | ORAL | Status: DC | PRN

## 2015-08-24 MED ORDER — ONDANSETRON HCL 4 MG PO TABS: 4.0000 mg | ORAL_TABLET | Freq: Three times a day (TID) | ORAL | Status: DC | PRN

## 2015-08-24 NOTE — Assessment & Plan Note (Signed)
Refer to pelvic floor rehab.  UA/ UC. Consider medication if not improving.

## 2015-08-24 NOTE — Progress Notes (Signed)
Subjective:    Patient ID: Karen Kramer, female    DOB: 1981-03-16, 34 y.o.   MRN: [id number]  HPI: Karen Kramer is a 34 y.o. female presenting on 08/24/2015 for Nausea and Emesis   HPI  Pt has had nausea and vomiting for about 2 mos. Pt gets motion sickness feeling and then gets very nauseated.  Feels very dizzy prior to throwing up. Describes sensation as being a tilt-a-whirl.  Took ondansetron to help nausea. Pt is able to keep most food and fluid down. Vomiting is occasional.  Pt feels symptoms of sinus infection developing. Facial pain and pressure on L side.   Incontinence: pt reports incontinence when she sneezes, vomits. Reports feelings of urgency. 2 episodes.  Pt had episode of chest pain yesterday. She believes it was anxiety related.   Past Medical History  Diagnosis Date  . Asthma   . Depression   . Atypical cervical glandular cells     Current Outpatient Prescriptions on File Prior to Visit  Medication Sig  . buPROPion (WELLBUTRIN SR) 150 MG 12 hr tablet Take 1 tablet (150 mg total) by mouth 2 (two) times daily.  . DULoxetine (CYMBALTA) 30 MG capsule Take 1 capsule (30 mg total) by mouth daily.  . DULoxetine (CYMBALTA) 60 MG capsule Take 1 capsule (60 mg total) by mouth daily.  . fluticasone (FLONASE) 50 MCG/ACT nasal spray 2 (TWO) SPRAY, NASAL, TWO TIMES DAILY  . meloxicam (MOBIC) 7.5 MG tablet Take 7.5 mg by mouth daily.   No current facility-administered medications on file prior to visit.    Review of Systems  Constitutional: Negative for fever and chills.  HENT: Positive for congestion and sinus pressure.   Respiratory: Negative for cough, chest tightness and wheezing.   Cardiovascular: Negative for chest pain and leg swelling.  Gastrointestinal: Positive for nausea and vomiting. Negative for abdominal pain, diarrhea and constipation.  Endocrine: Negative.  Negative for cold intolerance, heat intolerance, polydipsia, polyphagia and polyuria.    Genitourinary: Positive for difficulty urinating (incontinence.). Negative for dysuria.  Musculoskeletal: Negative.   Neurological: Positive for dizziness. Negative for light-headedness and numbness.  Psychiatric/Behavioral: Negative.    Per HPI unless specifically indicated above     Objective:    BP 125/86 mmHg  Pulse 90  Temp(Src) 98.1 F (36.7 C)  Resp 16  Ht 5\' 2"  (1.575 m)  Wt 146 lb (66.225 kg)  BMI 26.70 kg/m2  LMP 05/25/2011  Wt Readings from Last 3 Encounters:  08/24/15 146 lb (66.225 kg)  05/12/15 145 lb 3.2 oz (65.862 kg)  03/31/12 136 lb 12 oz (62.029 kg)    Physical Exam  Constitutional: She is oriented to person, place, and time. She appears well-developed and well-nourished.  HENT:  Head: Normocephalic and atraumatic.  Right Ear: Hearing and tympanic membrane normal.  Left Ear: Hearing and tympanic membrane normal.  Nose: Mucosal edema present. No rhinorrhea. Right sinus exhibits no maxillary sinus tenderness and no frontal sinus tenderness. Left sinus exhibits maxillary sinus tenderness and frontal sinus tenderness.  Eyes: Conjunctivae are normal. Pupils are equal, round, and reactive to light. Right eye exhibits nystagmus. Left eye exhibits nystagmus.  Neck: Neck supple.  Cardiovascular: Normal rate, regular rhythm and normal heart sounds.  Exam reveals no gallop and no friction rub.   No murmur heard. Pulmonary/Chest: Effort normal and breath sounds normal. She has no wheezes. She exhibits no tenderness.  Abdominal: Soft. Normal appearance and bowel sounds are normal. She exhibits no distension and no mass.  There is no tenderness. There is no rebound and no guarding.  Musculoskeletal: Normal range of motion. She exhibits no edema or tenderness.  Lymphadenopathy:    She has no cervical adenopathy.  Neurological: She is alert and oriented to person, place, and time.  Skin: Skin is warm and dry.   Results for orders placed or performed in visit on 03/31/12   Urine culture  Result Value Ref Range   Culture ESCHERICHIA COLI    Colony Count >=100,000 COLONIES/ML    Organism ID, Bacteria ESCHERICHIA COLI       Susceptibility   Escherichia coli -  (no method available)    AMPICILLIN 8 Sensitive     AMPICILLIN/SULBACTAM <=2 Sensitive     PIP/TAZO <=4 Sensitive     IMIPENEM <=0.25 Sensitive     CEFAZOLIN <=4 Sensitive     CEFOXITIN <=4 Sensitive     CEFTRIAXONE <=1 Sensitive     CEFTAZIDIME <=1 Sensitive     CEFEPIME <=1 Sensitive     GENTAMICIN <=1 Sensitive     TOBRAMYCIN <=1 Sensitive     CIPROFLOXACIN <=0.25 Sensitive     LEVOFLOXACIN <=0.12 Sensitive     NITROFURANTOIN <=16 Sensitive     TRIMETH/SULFA >=320 Resistant   Urinalysis Dipstick  Result Value Ref Range   Color, UA yellow    Clarity, UA clear    Glucose, UA neg    Bilirubin, UA neg    Ketones, UA neg    Spec Grav, UA 1.015    Blood, UA neg    pH, UA 6.5    Protein, UA neg    Urobilinogen, UA 0.2    Nitrite, UA pos    Leukocytes, UA moderate (2+)       Assessment & Plan:   Problem List Items Addressed This Visit      Other   Mixed incontinence urge and stress    Refer to pelvic floor rehab.  UA/ UC. Consider medication if not improving.       Relevant Orders   Ambulatory referral to Physical Therapy   POCT Urinalysis Dipstick   CULTURE, URINE COMPREHENSIVE    Other Visit Diagnoses    Other chest pain    -  Primary    Anxiety related. EKG normal.     Relevant Orders    EKG 12-Lead    BPPV (benign paroxysmal positional vertigo), left        Meclizine and ondansetron for symptoms. Consider ENT referral if symptoms are not improved. Likely exacerbated by sinus infection.     Relevant Medications    amoxicillin-clavulanate (AUGMENTIN) 875-125 MG tablet    meclizine (ANTIVERT) 32 MG tablet    Acute recurrent maxillary sinusitis        Augmentin BID x 10 days. Supportive care at home.     Relevant Medications    amoxicillin-clavulanate (AUGMENTIN) 875-125  MG tablet       Meds ordered this encounter  Medications  . amoxicillin-clavulanate (AUGMENTIN) 875-125 MG tablet    Sig: Take 1 tablet by mouth 2 (two) times daily.    Dispense:  20 tablet    Refill:  0    Order Specific Question:  Supervising Provider    Answer:  Arlis Porta [phone]  . meclizine (ANTIVERT) 32 MG tablet    Sig: Take 1 tablet (32 mg total) by mouth 3 (three) times daily as needed.    Dispense:  30 tablet    Refill:  0  Order Specific Question:  Supervising Provider    Answer:  Arlis Porta [id number]  . ondansetron (ZOFRAN) 4 MG tablet    Sig: Take 1 tablet (4 mg total) by mouth every 8 (eight) hours as needed for nausea or vomiting.    Dispense:  20 tablet    Refill:  0    Order Specific Question:  Supervising Provider    Answer:  Arlis Porta [id number]      Follow up plan: Return in about 6 weeks (around 10/05/2015) for urine problems.Marland Kitchen

## 2015-08-24 NOTE — Patient Instructions (Signed)

## 2015-08-25 LAB — POCT URINALYSIS DIPSTICK
Bilirubin, UA: NEGATIVE
Blood, UA: NEGATIVE
Glucose, UA: NEGATIVE
Ketones, UA: NEGATIVE
LEUKOCYTES UA: NEGATIVE
NITRITE UA: NEGATIVE
PH UA: 5
PROTEIN UA: NEGATIVE
Spec Grav, UA: 1.01
UROBILINOGEN UA: NEGATIVE

## 2015-08-26 LAB — CULTURE, URINE COMPREHENSIVE

## 2015-09-04 ENCOUNTER — Ambulatory Visit: Payer: Self-pay | Attending: Family Medicine | Admitting: Physical Therapy

## 2015-09-05 ENCOUNTER — Telehealth: Payer: Self-pay | Admitting: Family Medicine

## 2015-09-05 MED ORDER — ONDANSETRON HCL 4 MG PO TABS: 4.0000 mg | ORAL_TABLET | Freq: Three times a day (TID) | ORAL | Status: DC | PRN

## 2015-09-05 NOTE — Telephone Encounter (Signed)
Pt asked to talk to a nurse about some symptoms she is having.  Please call [phone]

## 2015-09-05 NOTE — Telephone Encounter (Signed)
Left message Zofran renewed and patient needs to schedule f/u.

## 2015-09-05 NOTE — Telephone Encounter (Signed)
Pt is still having same symptoms along with heart burn. She is wondering if it could be her gallbladder. She is not able to eat due nausea. Please advise.

## 2015-09-05 NOTE — Telephone Encounter (Signed)
I will renew her Zofran. She most likely needs to be seen. I did do a full abdominal exam last time because symptoms were thought to be vertigo. We need an exam and I can order and [place] if needed. Thanks! AK

## 2015-10-05 ENCOUNTER — Ambulatory Visit: Payer: Medicaid Other | Admitting: Family Medicine

## 2015-10-06 ENCOUNTER — Ambulatory Visit: Payer: Medicaid Other | Admitting: Family Medicine

## 2016-01-19 ENCOUNTER — Ambulatory Visit: Payer: Medicaid Other | Admitting: Family Medicine

## 2016-01-25 ENCOUNTER — Ambulatory Visit (INDEPENDENT_AMBULATORY_CARE_PROVIDER_SITE_OTHER): Payer: Medicaid Other | Admitting: Family Medicine

## 2016-01-25 ENCOUNTER — Encounter: Payer: Self-pay | Admitting: Family Medicine

## 2016-01-25 VITALS — BP 113/80 | HR 108 | Temp 98.4°F | Resp 16 | Ht 62.0 in | Wt 159.0 lb

## 2016-01-25 DIAGNOSIS — R635 Abnormal weight gain: Secondary | ICD-10-CM | POA: Diagnosis not present

## 2016-01-25 DIAGNOSIS — R6889 Other general symptoms and signs: Secondary | ICD-10-CM

## 2016-01-25 DIAGNOSIS — G4700 Insomnia, unspecified: Secondary | ICD-10-CM

## 2016-01-25 DIAGNOSIS — K219 Gastro-esophageal reflux disease without esophagitis: Secondary | ICD-10-CM | POA: Diagnosis not present

## 2016-01-25 DIAGNOSIS — F419 Anxiety disorder, unspecified: Principal | ICD-10-CM

## 2016-01-25 DIAGNOSIS — F418 Other specified anxiety disorders: Secondary | ICD-10-CM

## 2016-01-25 DIAGNOSIS — F32A Depression, unspecified: Secondary | ICD-10-CM | POA: Insufficient documentation

## 2016-01-25 DIAGNOSIS — F329 Major depressive disorder, single episode, unspecified: Secondary | ICD-10-CM | POA: Insufficient documentation

## 2016-01-25 MED ORDER — OMEPRAZOLE 20 MG PO CPDR: 20.0000 mg | DELAYED_RELEASE_CAPSULE | Freq: Every day | ORAL | Status: DC

## 2016-01-25 NOTE — Assessment & Plan Note (Signed)
Trial of PPI. Check CBC, CMET. Consider GI referral if symptoms don't improve.

## 2016-01-25 NOTE — Progress Notes (Signed)
Subjective:    Patient ID: Karen Kramer, female    DOB: June 18, 1981, 35 y.o.   MRN: [id number]  HPI: Karen Kramer is a 35 y.o. female presenting on 01/25/2016 for Depression   HPI  Pt presents for depression follow-up. Over past 2 mos has had rapid gain. 15lbs per our last office note. Feels tired all the time but cannot sleep at night. Sleeping 3 hours per night. Constantly waking up- does tossing and turning because hips bother her- takes aleve twice daily. . Lots of early morning awakenings. Symptoms started about 2-3 mos ago.  Doesn't a lot- drinks water. Exercising 2 days per week. Some skin changes- breaks out more.Stays cold all the time. No hair changes.  When she eats- feels fulls. Has regurg if she eats large meals. Mild epigastric pain.   Past Medical History  Diagnosis Date  . Asthma   . Depression   . Atypical cervical glandular cells     Current Outpatient Prescriptions on File Prior to Visit  Medication Sig  . buPROPion (WELLBUTRIN SR) 150 MG 12 hr tablet Take 1 tablet (150 mg total) by mouth 2 (two) times daily.  . DULoxetine (CYMBALTA) 30 MG capsule Take 1 capsule (30 mg total) by mouth daily.  . DULoxetine (CYMBALTA) 60 MG capsule Take 1 capsule (60 mg total) by mouth daily.  . fluticasone (FLONASE) 50 MCG/ACT nasal spray 2 (TWO) SPRAY, NASAL, TWO TIMES DAILY  . meclizine (ANTIVERT) 32 MG tablet Take 1 tablet (32 mg total) by mouth 3 (three) times daily as needed.  . ondansetron (ZOFRAN) 4 MG tablet Take 1 tablet (4 mg total) by mouth every 8 (eight) hours as needed for nausea or vomiting.   No current facility-administered medications on file prior to visit.    Review of Systems  Constitutional: Positive for fatigue and unexpected weight change. Negative for fever and chills.  HENT: Negative.   Respiratory: Negative for cough, chest tightness and wheezing.   Cardiovascular: Negative for chest pain and leg swelling.  Gastrointestinal: Positive for nausea and  abdominal pain. Negative for vomiting, diarrhea and constipation.  Endocrine: Positive for cold intolerance. Negative for heat intolerance, polydipsia, polyphagia and polyuria.  Genitourinary: Negative for dysuria and difficulty urinating.  Musculoskeletal: Negative.   Neurological: Negative for dizziness, light-headedness and numbness.  Psychiatric/Behavioral: Positive for sleep disturbance and dysphoric mood. Negative for suicidal ideas.   Per HPI unless specifically indicated above     Objective:    BP 113/80 mmHg  Pulse 108  Temp(Src) 98.4 F (36.9 C) (Oral)  Resp 16  Ht 5\' 2"  (1.575 m)  Wt 159 lb (72.122 kg)  BMI 29.07 kg/m2  LMP 05/25/2011  Wt Readings from Last 3 Encounters:  01/25/16 159 lb (72.122 kg)  08/24/15 146 lb (66.225 kg)  05/12/15 145 lb 3.2 oz (65.862 kg)    Physical Exam  Constitutional: She is oriented to person, place, and time. She appears well-developed and well-nourished.  HENT:  Head: Normocephalic and atraumatic.  Neck: Neck supple.  Cardiovascular: Normal rate, regular rhythm and normal heart sounds.  Exam reveals no gallop and no friction rub.   No murmur heard. Pulmonary/Chest: Effort normal and breath sounds normal. She has no wheezes. She exhibits no tenderness.  Abdominal: Soft. Normal appearance and bowel sounds are normal. She exhibits no distension and no mass. There is tenderness in the epigastric area. There is no rebound and no guarding.  Musculoskeletal: Normal range of motion. She exhibits no edema or tenderness.  Lymphadenopathy:    She has no cervical adenopathy.  Neurological: She is alert and oriented to person, place, and time.  Skin: Skin is warm and dry.   Results for orders placed or performed in visit on 08/24/15  CULTURE, URINE COMPREHENSIVE  Result Value Ref Range   Urine Culture, Comprehensive Final report    Result 1 Comment   POCT Urinalysis Dipstick  Result Value Ref Range   Color, UA yellow    Clarity, UA clear     Glucose, UA negative    Bilirubin, UA negative    Ketones, UA negative    Spec Grav, UA 1.010    Blood, UA negative    pH, UA 5.0    Protein, UA negative    Urobilinogen, UA negative    Nitrite, UA negative    Leukocytes, UA Negative Negative      Assessment & Plan:   Problem List Items Addressed This Visit      Digestive   Gastroesophageal reflux disease without esophagitis    Trial of PPI. Check CBC, CMET. Consider GI referral if symptoms don't improve.       Relevant Medications   omeprazole (PRILOSEC) 20 MG capsule     Other   Anxiety and depression - Primary    Symptoms could be consistent with uncontrolled depression. Consider medication titration or psych consult. Check TSH given weight gain. Check Vitamin D.       Relevant Orders   VITAMIN D 25 Hydroxy (Vit-D Deficiency, Fractures)   Vitamin B12   Insomnia    Likely 2/2 depression. Discussed sleep hygiene. Trial of tylenol PM to help with sleep and hip pain.        Other Visit Diagnoses    Abnormal weight gain        Relevant Orders    TSH    Comprehensive metabolic panel    Cold intolerance        Check TSH and CBC.     Relevant Orders    CBC with Differential/Platelet       Meds ordered this encounter  Medications  . omeprazole (PRILOSEC) 20 MG capsule    Sig: Take 1 capsule (20 mg total) by mouth daily.    Dispense:  30 capsule    Refill:  11    Order Specific Question:  Supervising Provider    Answer:  Arlis Porta [phone]      Follow up plan: Return in about 4 weeks (around 02/22/2016) for GERD. Depression. Marland Kitchen

## 2016-01-25 NOTE — Assessment & Plan Note (Signed)
Symptoms could be consistent with uncontrolled depression. Consider medication titration or psych consult. Check TSH given weight gain. Check Vitamin D.

## 2016-01-25 NOTE — Patient Instructions (Signed)
Stomach issues: Start omeprazole 20mg  once daily take in AM 1 hour prior to meal. Try reducing use of aleve or ibuprofen.   Sleep issues: Try tylenol PM at bedtime. Try pushing back your bedtime to 9-930. Sometimes being in bed too long can make it hard to sleep.   We will check labwork to see if we can determine source of your symptoms.

## 2016-01-25 NOTE — Assessment & Plan Note (Signed)
Likely 2/2 depression. Discussed sleep hygiene. Trial of tylenol PM to help with sleep and hip pain.

## 2016-01-27 LAB — COMPREHENSIVE METABOLIC PANEL
A/G RATIO: 1.7 (ref 1.2–2.2)
ALK PHOS: 62 IU/L (ref 39–117)
ALT: 15 IU/L (ref 0–32)
AST: 11 IU/L (ref 0–40)
Albumin: 4.3 g/dL (ref 3.5–5.5)
BUN/Creatinine Ratio: 13 (ref 9–23)
BUN: 11 mg/dL (ref 6–20)
Bilirubin Total: 0.4 mg/dL (ref 0.0–1.2)
CALCIUM: 9.1 mg/dL (ref 8.7–10.2)
CHLORIDE: 104 mmol/L (ref 96–106)
CO2: 23 mmol/L (ref 18–29)
Creatinine, Ser: 0.84 mg/dL (ref 0.57–1.00)
GFR calc Af Amer: 104 mL/min/{1.73_m2} (ref >59)
GFR, EST NON AFRICAN AMERICAN: 90 mL/min/{1.73_m2} (ref >59)
GLOBULIN, TOTAL: 2.5 g/dL (ref 1.5–4.5)
Glucose: 100 mg/dL — ABNORMAL HIGH (ref 65–99)
POTASSIUM: 4.3 mmol/L (ref 3.5–5.2)
SODIUM: 143 mmol/L (ref 134–144)
Total Protein: 6.8 g/dL (ref 6.0–8.5)

## 2016-01-27 LAB — CBC WITH DIFFERENTIAL/PLATELET
BASOS ABS: 0 10*3/uL (ref 0.0–0.2)
Basos: 0 %
EOS (ABSOLUTE): 0.1 10*3/uL (ref 0.0–0.4)
EOS: 3 %
HEMATOCRIT: 36.2 % (ref 34.0–46.6)
HEMOGLOBIN: 12.7 g/dL (ref 11.1–15.9)
IMMATURE GRANS (ABS): 0 10*3/uL (ref 0.0–0.1)
Immature Granulocytes: 0 %
LYMPHS ABS: 1.7 10*3/uL (ref 0.7–3.1)
LYMPHS: 36 %
MCH: 30.8 pg (ref 26.6–33.0)
MCHC: 35.1 g/dL (ref 31.5–35.7)
MCV: 88 fL (ref 79–97)
MONOCYTES: 8 %
Monocytes Absolute: 0.4 10*3/uL (ref 0.1–0.9)
Neutrophils Absolute: 2.5 10*3/uL (ref 1.4–7.0)
Neutrophils: 53 %
Platelets: 235 10*3/uL (ref 150–379)
RBC: 4.13 x10E6/uL (ref 3.77–5.28)
RDW: 12.9 % (ref 12.3–15.4)
WBC: 4.7 10*3/uL (ref 3.4–10.8)

## 2016-01-27 LAB — VITAMIN B12: VITAMIN B 12: 300 pg/mL (ref 211–946)

## 2016-01-27 LAB — TSH: TSH: 0.526 u[IU]/mL (ref 0.450–4.500)

## 2016-01-27 LAB — VITAMIN D 25 HYDROXY (VIT D DEFICIENCY, FRACTURES): VIT D 25 HYDROXY: 17.1 ng/mL — AB (ref 30.0–100.0)

## 2016-01-29 ENCOUNTER — Other Ambulatory Visit: Payer: Self-pay | Admitting: Family Medicine

## 2016-01-29 DIAGNOSIS — E559 Vitamin D deficiency, unspecified: Secondary | ICD-10-CM

## 2016-01-29 MED ORDER — VITAMIN D (ERGOCALCIFEROL) 1.25 MG (50000 UNIT) PO CAPS: 50000.0000 [IU] | ORAL_CAPSULE | ORAL | Status: DC

## 2016-01-30 LAB — HGB A1C W/O EAG: HEMOGLOBIN A1C: 5.2 % (ref 4.8–5.6)

## 2016-01-30 LAB — SPECIMEN STATUS REPORT

## 2016-02-01 ENCOUNTER — Other Ambulatory Visit: Payer: Self-pay | Admitting: Family Medicine

## 2016-02-20 ENCOUNTER — Ambulatory Visit (INDEPENDENT_AMBULATORY_CARE_PROVIDER_SITE_OTHER): Payer: Medicaid Other | Admitting: Family Medicine

## 2016-02-20 ENCOUNTER — Encounter: Payer: Self-pay | Admitting: Family Medicine

## 2016-02-20 VITALS — BP 118/75 | HR 109 | Temp 99.0°F | Resp 16 | Ht 62.0 in | Wt 161.0 lb

## 2016-02-20 DIAGNOSIS — E663 Overweight: Secondary | ICD-10-CM

## 2016-02-20 DIAGNOSIS — M25562 Pain in left knee: Secondary | ICD-10-CM

## 2016-02-20 DIAGNOSIS — K219 Gastro-esophageal reflux disease without esophagitis: Secondary | ICD-10-CM | POA: Diagnosis not present

## 2016-02-20 DIAGNOSIS — E559 Vitamin D deficiency, unspecified: Secondary | ICD-10-CM | POA: Insufficient documentation

## 2016-02-20 DIAGNOSIS — R1310 Dysphagia, unspecified: Secondary | ICD-10-CM

## 2016-02-20 MED ORDER — IBUPROFEN 600 MG PO TABS
600.0000 mg | ORAL_TABLET | Freq: Three times a day (TID) | ORAL | Status: DC | PRN
Start: 2016-02-20 — End: 2020-07-12

## 2016-02-20 MED ORDER — OMEPRAZOLE 20 MG PO CPDR: 20.0000 mg | DELAYED_RELEASE_CAPSULE | Freq: Two times a day (BID) | ORAL | Status: DC

## 2016-02-20 MED ORDER — RANITIDINE HCL 150 MG PO CAPS: 150.0000 mg | ORAL_CAPSULE | Freq: Every evening | ORAL | Status: DC

## 2016-02-20 MED ORDER — ONDANSETRON HCL 4 MG PO TABS: 4.0000 mg | ORAL_TABLET | Freq: Three times a day (TID) | ORAL | Status: DC | PRN

## 2016-02-20 NOTE — Patient Instructions (Signed)
GERd: Increase omeprazole to twice daily. Add Zantac as needed at bedtime to help with symptoms. Take ondansetron as needed for nausea. We will have you seen by GI to see if you need an endoscopy.   Knee pain: Take ibuprofen three times daily as needed. If knee is not improved in 1-2 weeks, please let me know and we will have you seen by ortho.

## 2016-02-20 NOTE — Assessment & Plan Note (Signed)
Fatigue improving with vitamin D.

## 2016-02-20 NOTE — Assessment & Plan Note (Signed)
20lb weight again since 08/2015. Pt would like to discuss phentermine or other medication for weight loss. Encouraged pt to allow [place] to address current issues before adding a weight loss medication. Recheck 4 weeks.

## 2016-02-20 NOTE — Progress Notes (Signed)
Subjective:    Patient ID: Karen Kramer, female    DOB: 12-10-1980, 34 y.o.   MRN: [id number]  HPI: Karen Kramer is a 35 y.o. female presenting on 02/20/2016 for Gastroesophageal Reflux and Knee Pain   HPI  Pt presents for follow-up on GERD and knee pain. Had a smoothie today- vomited after she ate. Feels full all the time. Vomits 2-3 hours after meal. No blood in vomit. Prilosec hasn't really helped with symptoms. When she takes a bite- feels like it is sitting in her throat. Stomach doesn't hurt, she just vomits.  Knee pain: Tedinitis in L knee in past. Very painful and swollen last week. Getting sharp pain anterior knee. On sides and middle. No falls or injuries. Having hard time raising leg up to get knee to bend. Taking tylenol to help with pain.   Past Medical History  Diagnosis Date  . Asthma   . Depression   . Atypical cervical glandular cells     Current Outpatient Prescriptions on File Prior to Visit  Medication Sig  . buPROPion (WELLBUTRIN SR) 150 MG 12 hr tablet TAKE 1 TABLET (150 MG TOTAL) BY MOUTH 2 (TWO) TIMES DAILY.  . DULoxetine (CYMBALTA) 30 MG capsule Take 1 capsule (30 mg total) by mouth daily.  . DULoxetine (CYMBALTA) 60 MG capsule Take 1 capsule (60 mg total) by mouth daily.  . fluticasone (FLONASE) 50 MCG/ACT nasal spray 2 (TWO) SPRAY, NASAL, TWO TIMES DAILY  . meclizine (ANTIVERT) 32 MG tablet Take 1 tablet (32 mg total) by mouth 3 (three) times daily as needed.  . Vitamin D, Ergocalciferol, (DRISDOL) 50000 units CAPS capsule Take 1 capsule (50,000 Units total) by mouth every 7 (seven) days.   No current facility-administered medications on file prior to visit.    Review of Systems  Constitutional: Negative for fever and chills.  HENT: Negative.   Respiratory: Negative for cough, chest tightness and wheezing.   Cardiovascular: Negative for chest pain and leg swelling.  Gastrointestinal: Positive for nausea. Negative for vomiting, abdominal pain,  diarrhea and constipation.  Endocrine: Negative.  Negative for cold intolerance, heat intolerance, polydipsia, polyphagia and polyuria.  Genitourinary: Negative for dysuria and difficulty urinating.  Musculoskeletal: Negative.   Neurological: Negative for dizziness, light-headedness and numbness.  Psychiatric/Behavioral: Negative.    Per HPI unless specifically indicated above     Objective:    BP 118/75 mmHg  Pulse 109  Temp(Src) 99 F (37.2 C) (Oral)  Resp 16  Ht 5\' 2"  (1.575 m)  Wt 161 lb (73.029 kg)  BMI 29.44 kg/m2  LMP 05/25/2011  Wt Readings from Last 3 Encounters:  02/20/16 161 lb (73.029 kg)  01/25/16 159 lb (72.122 kg)  08/24/15 146 lb (66.225 kg)    Physical Exam  Constitutional: She is oriented to person, place, and time. She appears well-developed and well-nourished.  HENT:  Head: Normocephalic and atraumatic.  Neck: Neck supple.  Cardiovascular: Normal rate, regular rhythm and normal heart sounds.  Exam reveals no gallop and no friction rub.   No murmur heard. Pulmonary/Chest: Effort normal and breath sounds normal. She has no wheezes. She exhibits no tenderness.  Abdominal: Soft. Normal appearance and bowel sounds are normal. She exhibits no distension and no mass. There is no tenderness. There is no rebound, no guarding and no CVA tenderness.  Musculoskeletal: Normal range of motion. She exhibits no edema or tenderness.       Right knee: Normal.       Left knee: She exhibits  effusion. She exhibits normal range of motion, no erythema, no LCL laxity and no MCL laxity. No tenderness found. No lateral joint line, no MCL, no LCL and no patellar tendon tenderness noted.  Negative anterior/posterior drawer. Negative varus/valgus stress test.   Lymphadenopathy:    She has no cervical adenopathy.  Neurological: She is alert and oriented to person, place, and time.  Skin: Skin is warm and dry.   Results for orders placed or performed in visit on 01/25/16  TSH    Result Value Ref Range   TSH 0.526 0.450 - 4.500 uIU/mL  Comprehensive metabolic panel  Result Value Ref Range   Glucose 100 (H) 65 - 99 mg/dL   BUN 11 6 - 20 mg/dL   Creatinine, Ser 0.84 0.57 - 1.00 mg/dL   GFR calc non Af Amer 90 >59 mL/min/1.73   GFR calc Af Amer 104 >59 mL/min/1.73   BUN/Creatinine Ratio 13 9 - 23   Sodium 143 134 - 144 mmol/L   Potassium 4.3 3.5 - 5.2 mmol/L   Chloride 104 96 - 106 mmol/L   CO2 23 18 - 29 mmol/L   Calcium 9.1 8.7 - 10.2 mg/dL   Total Protein 6.8 6.0 - 8.5 g/dL   Albumin 4.3 3.5 - 5.5 g/dL   Globulin, Total 2.5 1.5 - 4.5 g/dL   Albumin/Globulin Ratio 1.7 1.2 - 2.2   Bilirubin Total 0.4 0.0 - 1.2 mg/dL   Alkaline Phosphatase 62 39 - 117 IU/L   AST 11 0 - 40 IU/L   ALT 15 0 - 32 IU/L  CBC with Differential/Platelet  Result Value Ref Range   WBC 4.7 3.4 - 10.8 x10E3/uL   RBC 4.13 3.77 - 5.28 x10E6/uL   Hemoglobin 12.7 11.1 - 15.9 g/dL   Hematocrit 36.2 34.0 - 46.6 %   MCV 88 79 - 97 fL   MCH 30.8 26.6 - 33.0 pg   MCHC 35.1 31.5 - 35.7 g/dL   RDW 12.9 12.3 - 15.4 %   Platelets 235 150 - 379 x10E3/uL   Neutrophils 53 %   Lymphs 36 %   Monocytes 8 %   Eos 3 %   Basos 0 %   Neutrophils Absolute 2.5 1.4 - 7.0 x10E3/uL   Lymphocytes Absolute 1.7 0.7 - 3.1 x10E3/uL   Monocytes Absolute 0.4 0.1 - 0.9 x10E3/uL   EOS (ABSOLUTE) 0.1 0.0 - 0.4 x10E3/uL   Basophils Absolute 0.0 0.0 - 0.2 x10E3/uL   Immature Granulocytes 0 %   Immature Grans (Abs) 0.0 0.0 - 0.1 x10E3/uL  VITAMIN D 25 Hydroxy (Vit-D Deficiency, Fractures)  Result Value Ref Range   Vit D, 25-Hydroxy 17.1 (L) 30.0 - 100.0 ng/mL  Vitamin B12  Result Value Ref Range   Vitamin B-12 300 211 - 946 pg/mL  Hgb A1c w/o eAG  Result Value Ref Range   Hgb A1c MFr Bld 5.2 4.8 - 5.6 %  Specimen status report  Result Value Ref Range   specimen status report Comment       Assessment & Plan:   Problem List Items Addressed This Visit      Digestive   Gastroesophageal reflux disease  without esophagitis    Symptoms exacerbated with dyphagia. Increase PPI to BID and add H2. Refer to GI for evaluation for endoscopy. Alarm symptoms reviewed.       Relevant Medications   omeprazole (PRILOSEC) 20 MG capsule   ondansetron (ZOFRAN) 4 MG tablet   ranitidine (ZANTAC) 150 MG capsule  Other Relevant Orders   Ambulatory referral to Gastroenterology     Other   Vitamin D deficiency - Primary    Fatigue improving with vitamin D.       Overweight     20lb weight again since 08/2015. Pt would like to discuss phentermine or other medication for weight loss. Encouraged pt to allow [place] to address current issues before adding a weight loss medication. Recheck 4 weeks.        Other Visit Diagnoses    Lateral knee pain, left        Symptoms consistent with joint effusion. Motrin to help with pain. Ice, rest. Consider ortho if symptoms not improving.     Relevant Medications    ibuprofen (ADVIL,MOTRIN) 600 MG tablet    Dysphagia        Likely exacerbated GERD. Refer to GI.     Relevant Orders    Ambulatory referral to Gastroenterology       Meds ordered this encounter  Medications  . omeprazole (PRILOSEC) 20 MG capsule    Sig: Take 1 capsule (20 mg total) by mouth 2 (two) times daily before a meal.    Dispense:  60 capsule    Refill:  11    Order Specific Question:  Supervising Provider    Answer:  Arlis Porta [phone]  . ondansetron (ZOFRAN) 4 MG tablet    Sig: Take 1 tablet (4 mg total) by mouth every 8 (eight) hours as needed for nausea or vomiting.    Dispense:  20 tablet    Refill:  1    Order Specific Question:  Supervising Provider    Answer:  Arlis Porta [phone]  . ranitidine (ZANTAC) 150 MG capsule    Sig: Take 1 capsule (150 mg total) by mouth every evening.    Dispense:  30 capsule    Refill:  11    Order Specific Question:  Supervising Provider    Answer:  Arlis Porta [phone]  . ibuprofen (ADVIL,MOTRIN) 600 MG tablet    Sig:  Take 1 tablet (600 mg total) by mouth every 8 (eight) hours as needed.    Dispense:  30 tablet    Refill:  0    Order Specific Question:  Supervising Provider    Answer:  Arlis Porta [phone]      Follow up plan: Return in about 4 weeks (around 03/19/2016) for weight. Marland Kitchen

## 2016-02-20 NOTE — Assessment & Plan Note (Signed)
Symptoms exacerbated with dyphagia. Increase PPI to BID and add H2. Refer to GI for evaluation for endoscopy. Alarm symptoms reviewed.

## 2016-02-27 ENCOUNTER — Ambulatory Visit: Payer: Medicaid Other | Admitting: Family Medicine

## 2016-03-05 ENCOUNTER — Encounter (HOSPITAL_COMMUNITY): Payer: Self-pay | Admitting: Family Medicine

## 2016-03-05 ENCOUNTER — Emergency Department (HOSPITAL_COMMUNITY)
Admission: EM | Admit: 2016-03-05 | Discharge: 2016-03-06 | Disposition: A | Discharge status: Home / Self Care | Payer: Medicaid Other | Attending: Emergency Medicine | Admitting: Emergency Medicine

## 2016-03-05 DIAGNOSIS — Z7951 Long term (current) use of inhaled steroids: Secondary | ICD-10-CM | POA: Insufficient documentation

## 2016-03-05 DIAGNOSIS — Z79899 Other long term (current) drug therapy: Secondary | ICD-10-CM | POA: Diagnosis not present

## 2016-03-05 DIAGNOSIS — F329 Major depressive disorder, single episode, unspecified: Secondary | ICD-10-CM | POA: Insufficient documentation

## 2016-03-05 DIAGNOSIS — J45909 Unspecified asthma, uncomplicated: Secondary | ICD-10-CM | POA: Diagnosis not present

## 2016-03-05 DIAGNOSIS — K219 Gastro-esophageal reflux disease without esophagitis: Secondary | ICD-10-CM | POA: Insufficient documentation

## 2016-03-05 DIAGNOSIS — R1110 Vomiting, unspecified: Secondary | ICD-10-CM | POA: Diagnosis present

## 2016-03-05 MED ORDER — SODIUM CHLORIDE 0.9 % IV BOLUS (SEPSIS)
1000.0000 mL | Freq: Once | INTRAVENOUS | Status: AC
  Administered 2016-03-06: 1000 mL via INTRAVENOUS

## 2016-03-05 MED ORDER — KETOROLAC TROMETHAMINE 30 MG/ML IJ SOLN
30.0000 mg | Freq: Once | INTRAMUSCULAR | Status: AC
  Administered 2016-03-06: 30 mg via INTRAVENOUS
  Filled 2016-03-05: qty 1

## 2016-03-05 MED ORDER — DICYCLOMINE HCL 10 MG/ML IM SOLN
20.0000 mg | Freq: Once | INTRAMUSCULAR | Status: AC
  Administered 2016-03-06: 20 mg via INTRAMUSCULAR
  Filled 2016-03-05: qty 2

## 2016-03-05 MED ORDER — ONDANSETRON HCL 4 MG/2ML IJ SOLN
4.0000 mg | Freq: Once | INTRAMUSCULAR | Status: AC
  Administered 2016-03-06: 4 mg via INTRAVENOUS
  Filled 2016-03-05: qty 2

## 2016-03-05 NOTE — ED Notes (Signed)
MD at bedside. 

## 2016-03-05 NOTE — ED Notes (Signed)
Patient reports she is scheduled for a consultation for an endoscopy on the 03/12/16. Pt has been vomiting after eating but for the last two weeks it has got worse. Pt reports she has been vomiting after and during attempting to eat a meal. Also, complains of headache and tingling sensation throughout her whole body. Headache and tingling started today. Patient was able to ambulate to room with no assistance and steady gait. Dizziness and no blurry vision reported.

## 2016-03-05 NOTE — ED Notes (Signed)
Bed: WA02 Expected date:  Expected time:  Means of arrival:  Comments: 35 yo F  Fall

## 2016-03-05 NOTE — ED Provider Notes (Signed)
CSN: [id number]     Arrival date & time 03/05/16  1930 History  By signing my name below, I, Karen Kramer, attest that this documentation has been prepared under the direction and in the presence of Noretta Frier, MD. Electronically Signed: Judithann Sauger, ED Scribe. 03/05/2016. 11:54 PM.    Chief Complaint  Patient presents with  . Emesis  . Headache  . Tingling   Patient is a 35 y.o. female presenting with vomiting. The history is provided by the patient. No language interpreter was used.  Emesis Severity:  Moderate Duration:  4 days Timing:  Intermittent Number of daily episodes:  6 Quality:  Stomach contents Progression:  Worsening Chronicity:  New Recent urination:  Normal Relieved by:  None tried Worsened by:  Nothing tried Ineffective treatments:  Antiemetics Associated symptoms: no chills and no diarrhea   Risk factors: no sick contacts    HPI Comments: Karen Kramer is a 35 y.o. female who presents to the Emergency Department complaining of multiple episodes of non-bloody vomiting onset 4 days ago. She reports 5-6 episodes in 24 hours. She explains that she vomits immediately after eating or drinking. She states that she was given Zofran tablets with no relief. No sick contacts noted. She reports that she is currently on Prilosec for GERD and has an endoscopy on 03/12/16. She denies any fever, chills, diarrhea, constipation, or dysuria.   Past Medical History  Diagnosis Date  . Asthma   . Depression   . Atypical cervical glandular cells    Past Surgical History  Procedure Laterality Date  . Tubal ligation  2008  . Abdominal hysterectomy      Partical   Family History  Problem Relation Age of Onset  . Cancer Mother     pancreatic  . Lung disease Father   . Cancer Maternal Grandfather     prostate  . Cancer Paternal Grandfather     prostate  . Seizures Father 66   Social History  Substance Use Topics  . Smoking status: Never Smoker   . Smokeless  tobacco: None  . Alcohol Use: Yes     Comment: "Once every few months"   OB History    No data available     Review of Systems  Constitutional: Negative for fever and chills.  Gastrointestinal: Positive for vomiting. Negative for diarrhea and constipation.  Genitourinary: Negative for dysuria.  All other systems reviewed and are negative.     Allergies  Review of patient's allergies indicates no known allergies.  Home Medications   Prior to Admission medications   Medication Sig Start Date End Date Taking? Authorizing Provider  buPROPion (WELLBUTRIN SR) 150 MG 12 hr tablet TAKE 1 TABLET (150 MG TOTAL) BY MOUTH 2 (TWO) TIMES DAILY. 02/01/16  Yes Amy Overton Mam, NP  DULoxetine (CYMBALTA) 30 MG capsule Take 1 capsule (30 mg total) by mouth daily. 05/12/15  Yes Amy Overton Mam, NP  DULoxetine (CYMBALTA) 60 MG capsule Take 1 capsule (60 mg total) by mouth daily. 05/12/15  Yes Amy Overton Mam, NP  fluticasone (FLONASE) 50 MCG/ACT nasal spray 2 (TWO) SPRAY, NASAL, TWO TIMES DAILY 04/12/15  Yes Historical Provider, MD  ibuprofen (ADVIL,MOTRIN) 600 MG tablet Take 1 tablet (600 mg total) by mouth every 8 (eight) hours as needed. 02/20/16  Yes Amy Overton Mam, NP  omeprazole (PRILOSEC) 20 MG capsule Take 1 capsule (20 mg total) by mouth 2 (two) times daily before a meal. 02/20/16  Yes Amy Overton Mam, NP  ondansetron (  ZOFRAN) 4 MG tablet Take 1 tablet (4 mg total) by mouth every 8 (eight) hours as needed for nausea or vomiting. 02/20/16  Yes Amy Overton Mam, NP  ranitidine (ZANTAC) 150 MG capsule Take 1 capsule (150 mg total) by mouth every evening. 02/20/16  Yes Amy Overton Mam, NP  vitamin B-12 (CYANOCOBALAMIN) 50 MCG tablet Take 50 mcg by mouth daily.   Yes Historical Provider, MD  Vitamin D, Ergocalciferol, (DRISDOL) 50000 units CAPS capsule Take 1 capsule (50,000 Units total) by mouth every 7 (seven) days. 01/29/16  Yes Amy Overton Mam, NP  meclizine (ANTIVERT) 32 MG tablet Take 1 tablet  (32 mg total) by mouth 3 (three) times daily as needed. Patient taking differently: Take 32 mg by mouth 3 (three) times daily as needed for dizziness.  08/24/15   Amy Lauren Krebs, NP   BP 116/91 mmHg  Pulse 87  Temp(Src) 98 F (36.7 C) (Oral)  Resp 16  Ht 5\' 2"  (1.575 m)  Wt 156 lb (70.761 kg)  BMI 28.53 kg/m2  SpO2 99%  LMP 05/25/2011 Physical Exam  Constitutional: She is oriented to person, place, and time. She appears well-developed and well-nourished.  HENT:  Head: Normocephalic and atraumatic.  Mouth/Throat: Oropharynx is clear and moist. No oropharyngeal exudate.  Eyes: Pupils are equal, round, and reactive to light.  Cardiovascular: Normal rate and regular rhythm.   Pulmonary/Chest: Effort normal and breath sounds normal.  Lungs clear  Abdominal:  Gassy  Neurological: She is alert and oriented to person, place, and time. She has normal reflexes.  DTRs intact  Skin: Skin is warm and dry.  Psychiatric: She has a normal mood and affect.  Nursing note and vitals reviewed.   ED Course  Procedures (including critical care time) DIAGNOSTIC STUDIES: Oxygen Saturation is 99% on RA, normal by my interpretation.    COORDINATION OF CARE: 11:46 PM- Pt advised of plan for treatment and pt agrees. Pt will receive IV fluids, Zofran, and Bentyl. Pt will receive lab work for further evaluation.    Labs Review Labs Reviewed - No data to display  Imaging Review No results found.   Lain Tetterton, MD has personally reviewed and evaluated these images and lab results as part of her medical decision-making.   EKG Interpretation None      MDM   Final diagnoses:  None   Filed Vitals:   03/06/16 0200 03/06/16 0230  BP: 97/60 107/66  Pulse: 81 89  Temp:    Resp:     Results for orders placed or performed during the hospital encounter of 03/05/16  CBC with Differential/Platelet  Result Value Ref Range   WBC 6.7 4.0 - 10.5 K/uL   RBC 4.33 3.87 - 5.11 MIL/uL   Hemoglobin  13.3 12.0 - 15.0 g/dL   HCT 37.9 36.0 - 46.0 %   MCV 87.5 78.0 - 100.0 fL   MCH 30.7 26.0 - 34.0 pg   MCHC 35.1 30.0 - 36.0 g/dL   RDW 12.3 11.5 - 15.5 %   Platelets 246 150 - 400 K/uL   Neutrophils Relative % 44 %   Neutro Abs 3.0 1.7 - 7.7 K/uL   Lymphocytes Relative 48 %   Lymphs Abs 3.2 0.7 - 4.0 K/uL   Monocytes Relative 6 %   Monocytes Absolute 0.4 0.1 - 1.0 K/uL   Eosinophils Relative 2 %   Eosinophils Absolute 0.1 0.0 - 0.7 K/uL   Basophils Relative 0 %   Basophils Absolute 0.0 0.0 - 0.1  K/uL  Urinalysis, Routine w reflex microscopic (not at [facility])  Result Value Ref Range   Color, Urine AMBER (A) YELLOW   APPearance CLOUDY (A) CLEAR   Specific Gravity, Urine 1.031 (H) 1.005 - 1.030   pH 6.0 5.0 - 8.0   Glucose, UA NEGATIVE NEGATIVE mg/dL   Hgb urine dipstick NEGATIVE NEGATIVE   Bilirubin Urine NEGATIVE NEGATIVE   Ketones, ur NEGATIVE NEGATIVE mg/dL   Protein, ur NEGATIVE NEGATIVE mg/dL   Nitrite NEGATIVE NEGATIVE   Leukocytes, UA NEGATIVE NEGATIVE  Lipase, blood  Result Value Ref Range   Lipase 20 11 - 51 U/L  I-stat chem 8, ed  Result Value Ref Range   Sodium 139 135 - 145 mmol/L   Potassium 3.2 (L) 3.5 - 5.1 mmol/L   Chloride 104 101 - 111 mmol/L   BUN 19 6 - 20 mg/dL   Creatinine, Ser 0.90 0.44 - 1.00 mg/dL   Glucose, Bld 100 (H) 65 - 99 mg/dL   Calcium, Ion 1.04 (L) 1.12 - 1.23 mmol/L   TCO2 22 0 - 100 mmol/L   Hemoglobin 12.9 12.0 - 15.0 g/dL   HCT 38.0 36.0 - 46.0 %   Dg Abd Acute W/chest  03/06/2016  CLINICAL DATA:  Subacute onset of right lower quadrant abdominal pain and nausea. Initial encounter. EXAM: DG ABDOMEN ACUTE W/ 1V CHEST COMPARISON:  None. FINDINGS: The lungs are well-aerated and clear. There is no evidence of focal opacification, pleural effusion or pneumothorax. The cardiomediastinal silhouette is within normal limits. The visualized bowel gas pattern is unremarkable. Scattered stool and air are seen within the colon; there is no evidence of  small bowel dilatation to suggest obstruction. No free intra-abdominal air is identified on the provided upright view. No acute osseous abnormalities are seen; the sacroiliac joints are unremarkable in appearance. A metallic piercing is noted overlying the umbilicus. IMPRESSION: 1. Unremarkable bowel gas pattern; no free intra-abdominal air seen. Small amount of stool noted in the colon. 2. No acute cardiopulmonary process seen. Electronically Signed   By: Garald Balding M.D.   On: 03/06/2016 01:40    Medications  sodium chloride 0.9 % bolus 1,000 mL (0 mLs Intravenous Stopped 03/06/16 0224)  ondansetron (ZOFRAN) injection 4 mg (4 mg Intravenous Given 03/06/16 0008)  dicyclomine (BENTYL) injection 20 mg (20 mg Intramuscular Given 03/06/16 0008)  ketorolac (TORADOL) 30 MG/ML injection 30 mg (30 mg Intravenous Given 03/06/16 0008)  famotidine (PEPCID) IVPB 20 mg premix (0 mg Intravenous Stopped 03/06/16 0118)  gi cocktail (Maalox,Lidocaine,Donnatal) (30 mLs Oral Given 03/06/16 0048)   PO challenged successfully in the department   Symptoms consistent with patient's known GERD.  Will change to zofran ODT.   Carafate suspension in addition to PPI.  Follow up with your GI Specialist.    I personally performed the services described in this documentation, which was scribed in my presence. The recorded information has been reviewed and is accurate.     Veatrice Kells, MD 03/06/16 [id number]

## 2016-03-06 ENCOUNTER — Emergency Department (HOSPITAL_COMMUNITY): Payer: Medicaid Other

## 2016-03-06 ENCOUNTER — Encounter: Payer: Self-pay | Admitting: *Deleted

## 2016-03-06 ENCOUNTER — Emergency Department: Payer: Medicaid Other

## 2016-03-06 ENCOUNTER — Emergency Department
Admission: EM | Admit: 2016-03-06 | Discharge: 2016-03-06 | Disposition: A | Discharge status: Home / Self Care | Payer: Medicaid Other | Attending: Emergency Medicine | Admitting: Emergency Medicine

## 2016-03-06 ENCOUNTER — Encounter (HOSPITAL_COMMUNITY): Payer: Self-pay | Admitting: Emergency Medicine

## 2016-03-06 DIAGNOSIS — Z79899 Other long term (current) drug therapy: Secondary | ICD-10-CM | POA: Insufficient documentation

## 2016-03-06 DIAGNOSIS — R10816 Epigastric abdominal tenderness: Secondary | ICD-10-CM

## 2016-03-06 DIAGNOSIS — J45909 Unspecified asthma, uncomplicated: Secondary | ICD-10-CM | POA: Insufficient documentation

## 2016-03-06 DIAGNOSIS — F329 Major depressive disorder, single episode, unspecified: Secondary | ICD-10-CM | POA: Insufficient documentation

## 2016-03-06 DIAGNOSIS — R109 Unspecified abdominal pain: Secondary | ICD-10-CM

## 2016-03-06 DIAGNOSIS — R1011 Right upper quadrant pain: Secondary | ICD-10-CM | POA: Insufficient documentation

## 2016-03-06 DIAGNOSIS — Z7951 Long term (current) use of inhaled steroids: Secondary | ICD-10-CM | POA: Insufficient documentation

## 2016-03-06 LAB — CBC WITH DIFFERENTIAL/PLATELET
BASOS ABS: 0 10*3/uL (ref 0.0–0.1)
Basophils Relative: 0 %
Eosinophils Absolute: 0.1 10*3/uL (ref 0.0–0.7)
Eosinophils Relative: 2 %
HEMATOCRIT: 37.9 % (ref 36.0–46.0)
HEMOGLOBIN: 13.3 g/dL (ref 12.0–15.0)
LYMPHS ABS: 3.2 10*3/uL (ref 0.7–4.0)
LYMPHS PCT: 48 %
MCH: 30.7 pg (ref 26.0–34.0)
MCHC: 35.1 g/dL (ref 30.0–36.0)
MCV: 87.5 fL (ref 78.0–100.0)
Monocytes Absolute: 0.4 10*3/uL (ref 0.1–1.0)
Monocytes Relative: 6 %
NEUTROS ABS: 3 10*3/uL (ref 1.7–7.7)
Neutrophils Relative %: 44 %
Platelets: 246 10*3/uL (ref 150–400)
RBC: 4.33 MIL/uL (ref 3.87–5.11)
RDW: 12.3 % (ref 11.5–15.5)
WBC: 6.7 10*3/uL (ref 4.0–10.5)

## 2016-03-06 LAB — I-STAT CHEM 8, ED
BUN: 19 mg/dL (ref 6–20)
CHLORIDE: 104 mmol/L (ref 101–111)
CREATININE: 0.9 mg/dL (ref 0.44–1.00)
Calcium, Ion: 1.04 mmol/L — ABNORMAL LOW (ref 1.12–1.23)
GLUCOSE: 100 mg/dL — AB (ref 65–99)
HCT: 38 % (ref 36.0–46.0)
HEMOGLOBIN: 12.9 g/dL (ref 12.0–15.0)
Potassium: 3.2 mmol/L — ABNORMAL LOW (ref 3.5–5.1)
SODIUM: 139 mmol/L (ref 135–145)
TCO2: 22 mmol/L (ref 0–100)

## 2016-03-06 LAB — URINALYSIS, ROUTINE W REFLEX MICROSCOPIC
Bilirubin Urine: NEGATIVE
GLUCOSE, UA: NEGATIVE mg/dL
HGB URINE DIPSTICK: NEGATIVE
Ketones, ur: NEGATIVE mg/dL
LEUKOCYTES UA: NEGATIVE
Nitrite: NEGATIVE
PH: 6 (ref 5.0–8.0)
PROTEIN: NEGATIVE mg/dL
SPECIFIC GRAVITY, URINE: 1.031 — AB (ref 1.005–1.030)

## 2016-03-06 LAB — COMPREHENSIVE METABOLIC PANEL
ALBUMIN: 4.5 g/dL (ref 3.5–5.0)
ALK PHOS: 61 U/L (ref 38–126)
ALT: 12 U/L — AB (ref 14–54)
ANION GAP: 8 (ref 5–15)
AST: 17 U/L (ref 15–41)
BUN: 20 mg/dL (ref 6–20)
CALCIUM: 9.2 mg/dL (ref 8.9–10.3)
CO2: 25 mmol/L (ref 22–32)
Chloride: 109 mmol/L (ref 101–111)
Creatinine, Ser: 1.01 mg/dL — ABNORMAL HIGH (ref 0.44–1.00)
GFR calc Af Amer: >60 mL/min (ref >60)
GFR calc non Af Amer: >60 mL/min (ref >60)
GLUCOSE: 76 mg/dL (ref 65–99)
Potassium: 3.8 mmol/L (ref 3.5–5.1)
SODIUM: 142 mmol/L (ref 135–145)
Total Bilirubin: 0.5 mg/dL (ref 0.3–1.2)
Total Protein: 7.2 g/dL (ref 6.5–8.1)

## 2016-03-06 LAB — CBC
HCT: 37.5 % (ref 35.0–47.0)
HEMOGLOBIN: 13 g/dL (ref 12.0–16.0)
MCH: 30.9 pg (ref 26.0–34.0)
MCHC: 34.8 g/dL (ref 32.0–36.0)
MCV: 88.8 fL (ref 80.0–100.0)
Platelets: 219 10*3/uL (ref 150–440)
RBC: 4.23 MIL/uL (ref 3.80–5.20)
RDW: 12.8 % (ref 11.5–14.5)
WBC: 5.7 10*3/uL (ref 3.6–11.0)

## 2016-03-06 LAB — LIPASE, BLOOD
Lipase: 19 U/L (ref 11–51)
Lipase: 20 U/L (ref 11–51)

## 2016-03-06 MED ORDER — FAMOTIDINE IN NACL 20-0.9 MG/50ML-% IV SOLN
20.0000 mg | Freq: Once | INTRAVENOUS | Status: AC
  Administered 2016-03-06: 20 mg via INTRAVENOUS
  Filled 2016-03-06: qty 50

## 2016-03-06 MED ORDER — GI COCKTAIL ~~LOC~~
30.0000 mL | Freq: Once | ORAL | Status: AC
  Administered 2016-03-06: 30 mL via ORAL
  Filled 2016-03-06: qty 30

## 2016-03-06 MED ORDER — SUCRALFATE 1 GM/10ML PO SUSP: 1.0000 g | Freq: Three times a day (TID) | ORAL | Status: DC

## 2016-03-06 MED ORDER — ONDANSETRON 8 MG PO TBDP: ORAL_TABLET | ORAL | Status: DC

## 2016-03-06 NOTE — ED Notes (Signed)
Patient transported to Ultrasound 

## 2016-03-06 NOTE — ED Notes (Addendum)
Patient reports was seen at [facility] last night, here for same treatments. Patient reports increased pain. C/o vomiting and nausea for a month and right abdominal pain for 2 weeks. Denies bloody emesis, urinary symptoms or diarrhea.

## 2016-03-06 NOTE — Discharge Instructions (Signed)
Please return to the emergency room right away if you are to develop a fever, severe nausea, your pain becomes severe or worsens, you are unable to keep food down, begin vomiting any dark or bloody fluid, you develop any dark or bloody stools, feel dehydrated, or other new concerns or symptoms arise.   Abdominal Pain, Adult Many things can cause abdominal pain. Usually, abdominal pain is not caused by a disease and will improve without treatment. It can often be observed and treated at home. Your health care provider will do a physical exam and possibly order blood tests and X-rays to help determine the seriousness of your pain. However, in many cases, more time must pass before a clear cause of the pain can be found. Before that point, your health care provider may not know if you need more testing or further treatment. HOME CARE INSTRUCTIONS Monitor your abdominal pain for any changes. The following actions may help to alleviate any discomfort you are experiencing:  Only take over-the-counter or prescription medicines as directed by your health care provider.  Do not take laxatives unless directed to do so by your health care provider.  Try a clear liquid diet (broth, tea, or water) as directed by your health care provider. Slowly move to a bland diet as tolerated. SEEK MEDICAL CARE IF:  You have unexplained abdominal pain.  You have abdominal pain associated with nausea or diarrhea.  You have pain when you urinate or have a bowel movement.  You experience abdominal pain that wakes you in the night.  You have abdominal pain that is worsened or improved by eating food.  You have abdominal pain that is worsened with eating fatty foods.  You have a fever. SEEK IMMEDIATE MEDICAL CARE IF:  Your pain does not go away within 2 hours.  You keep throwing up (vomiting).  Your pain is felt only in portions of the abdomen, such as the right side or the left lower portion of the  abdomen.  You pass bloody or black tarry stools. MAKE SURE YOU:  Understand these instructions.  Will watch your condition.  Will get help right away if you are not doing well or get worse.   This information is not intended to replace advice given to you by your health care provider. Make sure you discuss any questions you have with your health care provider.   Document Released: 06/19/2005 Document Revised: 05/31/2015 Document Reviewed: 05/19/2013 Elsevier Interactive Patient Education [employer].

## 2016-03-06 NOTE — ED Notes (Signed)
Pt was seen at [facility] last night for abdominal pain,  Pt has had abdominal pain for the last month, pt reports vomiting after eating, feeling dizzy at times

## 2016-03-06 NOTE — ED Provider Notes (Signed)
[facility] Emergency Department Provider Note  ____________________________________________  Time seen: Approximately 7:46 PM  I have reviewed the triage vital signs and the nursing notes.   HISTORY  Chief Complaint Abdominal Pain    HPI Karen Kramer is a 35 y.o. female presents for evaluation of intermittent nausea with vomiting, and mild discomfort in her upper abdomen.  Patient tells me that for the last several months she's been experiencing discomfort, vomiting, nausea and a sense of hard to describe pain in her right upper abdomen. She reports is been going on for quite some time, however the last few days it seemed worse. She's not had any fevers or chills. No chest pain or trouble breathing. No shortness of breath.  She denies pregnancy, has had a hysterectomy. She's had 2 previous children. She is moving her bowels normally, she does not feel constipated. She is taking acid reflux medicine daily, but finding no relief.   Past Medical History  Diagnosis Date  . Asthma   . Depression   . Atypical cervical glandular cells     Patient Active Problem List   Diagnosis Date Noted  . Vitamin D deficiency 02/20/2016  . Overweight 02/20/2016  . Gastroesophageal reflux disease without esophagitis 01/25/2016  . Mixed incontinence urge and stress 08/24/2015  . Anxiety and depression 05/12/2015  . Insomnia 05/12/2015  . Infection of urinary tract 11/20/2012  . Edema 06/11/2012  . Complicated UTI (urinary tract infection) 03/31/2012  . Left knee pain 11/22/2011  . Syncope 05/28/2011  . LOW BACK PAIN, CHRONIC 05/17/2009  . SACROILIAC JOINT DYSFUNCTION 05/17/2009  . TROCHANTERIC BURSITIS, RIGHT 05/17/2009  . FATIGUE 03/23/2009  . MIGRAINE, COMMON 04/15/2008  . ASTHMA, PERSISTENT, MODERATE 04/15/2008    Past Surgical History  Procedure Laterality Date  . Tubal ligation  2008  . Abdominal hysterectomy      Partical    Current Outpatient Rx   Name  Route  Sig  Dispense  Refill  . buPROPion (WELLBUTRIN SR) 150 MG 12 hr tablet      TAKE 1 TABLET (150 MG TOTAL) BY MOUTH 2 (TWO) TIMES DAILY.   90 tablet   3   . DULoxetine (CYMBALTA) 30 MG capsule   Oral   Take 1 capsule (30 mg total) by mouth daily.   90 capsule   3   . DULoxetine (CYMBALTA) 60 MG capsule   Oral   Take 1 capsule (60 mg total) by mouth daily.   90 capsule   3   . fluticasone (FLONASE) 50 MCG/ACT nasal spray      2 (TWO) SPRAY, NASAL, TWO TIMES DAILY      7   . ibuprofen (ADVIL,MOTRIN) 600 MG tablet   Oral   Take 1 tablet (600 mg total) by mouth every 8 (eight) hours as needed.   30 tablet   0   . meclizine (ANTIVERT) 32 MG tablet   Oral   Take 1 tablet (32 mg total) by mouth 3 (three) times daily as needed. Patient taking differently: Take 32 mg by mouth 3 (three) times daily as needed for dizziness.    30 tablet   0   . omeprazole (PRILOSEC) 20 MG capsule   Oral   Take 1 capsule (20 mg total) by mouth 2 (two) times daily before a meal.   60 capsule   11   . ondansetron (ZOFRAN ODT) 8 MG disintegrating tablet      8mg  ODT q8 hours prn nausea  12 tablet   0   . ondansetron (ZOFRAN) 4 MG tablet   Oral   Take 1 tablet (4 mg total) by mouth every 8 (eight) hours as needed for nausea or vomiting.   20 tablet   1   . ranitidine (ZANTAC) 150 MG capsule   Oral   Take 1 capsule (150 mg total) by mouth every evening.   30 capsule   11   . sucralfate (CARAFATE) 1 GM/10ML suspension   Oral   Take 10 mLs (1 g total) by mouth 4 (four) times daily -  with meals and at bedtime.   420 mL   0   . vitamin B-12 (CYANOCOBALAMIN) 50 MCG tablet   Oral   Take 50 mcg by mouth daily.         . Vitamin D, Ergocalciferol, (DRISDOL) 50000 units CAPS capsule   Oral   Take 1 capsule (50,000 Units total) by mouth every 7 (seven) days.   12 capsule   1     Allergies Review of patient's allergies indicates no known allergies.  Family  History  Problem Relation Age of Onset  . Cancer Mother     pancreatic  . Lung disease Father   . Cancer Maternal Grandfather     prostate  . Cancer Paternal Grandfather     prostate  . Seizures Father 24    Social History Social History  Substance Use Topics  . Smoking status: Never Smoker   . Smokeless tobacco: None  . Alcohol Use: Yes     Comment: "Once every few months"    Review of Systems Constitutional: No fever/chills Eyes: No visual changes. ENT: No sore throat. Cardiovascular: Denies chest pain. Respiratory: Denies shortness of breath. Gastrointestinal: No diarrhea.  No constipation. Genitourinary: Negative for dysuria. Musculoskeletal: Negative for back pain. Skin: Negative for rash. Neurological: Negative for headaches, focal weakness or numbness.  10-point ROS otherwise negative.  Patient notes she has no endoscopy scheduled for June 20  ____________________________________________   PHYSICAL EXAM:  VITAL SIGNS: ED Triage Vitals  Enc Vitals Group     BP 03/06/16 1704 126/84 mmHg     Pulse Rate 03/06/16 1704 105     Resp 03/06/16 1704 20     Temp 03/06/16 1704 97.8 F (36.6 C)     Temp Source 03/06/16 1704 Oral     SpO2 03/06/16 1704 97 %     Weight 03/06/16 1703 156 lb (70.761 kg)     Height 03/06/16 1703 5\' 2"  (1.575 m)     Head Cir --      Peak Flow --      Pain Score 03/06/16 1703 7     Pain Loc --      Pain Edu? --      Excl. in [place]? --    Constitutional: Alert and oriented. Well appearing and in no acute distress. Eyes: Conjunctivae are normal. PERRL. EOMI. Head: Atraumatic. Nose: No congestion/rhinnorhea. Mouth/Throat: Mucous membranes are moist.  Oropharynx non-erythematous. Neck: No stridor.   Cardiovascular: Normal rate, regular rhythm. Grossly normal heart sounds.  Good peripheral circulation. Respiratory: Normal respiratory effort.  No retractions. Lungs CTAB. Gastrointestinal: Soft and nontenderExcept for moderate discomfort  in the epigastrium and right upper quadrant. She does localize some pain to the right upper quadrant with Percell Miller.. No distention. No abdominal bruits. No CVA tenderness. Musculoskeletal: No lower extremity tenderness nor edema.  No joint effusions. Neurologic:  Normal speech and language. No gross focal neurologic  deficits are appreciated. Skin:  Skin is warm, dry and intact. No rash noted. Psychiatric: Mood and affect are normal. Speech and behavior are normal.  ____________________________________________   LABS (all labs ordered are listed, but only abnormal results are displayed)  Labs Reviewed  COMPREHENSIVE METABOLIC PANEL - Abnormal; Notable for the following:    Creatinine, Ser 1.01 (*)    ALT 12 (*)    All other components within normal limits  LIPASE, BLOOD  CBC   ____________________________________________  EKG   ____________________________________________  RADIOLOGY  US Abdomen Limited RUQ (Final result) Result time: 03/06/16 20:17:44   Final result by Rad Results In Interface (03/06/16 20:17:44)   Narrative:   CLINICAL DATA: Right upper abdominal pain x1 month  EXAM: US ABDOMEN LIMITED - RIGHT UPPER QUADRANT  COMPARISON: None.  FINDINGS: Gallbladder:  No gallstones or wall thickening visualized. No sonographic Murphy sign noted by sonographer.  Common bile duct:  Diameter: 3 mm. Unremarkable.  Liver:  No focal lesion identified. Within normal limits in parenchymal echogenicity.  IMPRESSION: Normal   Electronically Signed By: Lucrezia Europe M.D. On: 03/06/2016 20:17     ____________________________________________   PROCEDURES  Procedure(s) performed: None  Critical Care performed: No  ____________________________________________   INITIAL IMPRESSION / ASSESSMENT AND PLAN / ED COURSE  Pertinent labs & imaging results that were available during my care of the patient were reviewed by me and considered in my medical  decision making (see chart for details).  Differential diagnosis includes but is not limited to, abdominal perforation, aortic dissection, cholecystitis, appendicitis, diverticulitis, colitis, esophagitis/gastritis, kidney stone, pyelonephritis, urinary tract infection, aortic aneurysm. All are considered in decision and treatment plan. Based upon the patient's presentation and risk factors, she does have focal tenderness in the epigastrium and right upper quadrant. Her symptoms seem to be very insidious in onset, no fever, and do not appear consistent with an acute infectious process like appendicitis. She does have pain in the upper right abdomen however. We will obtain an ultrasound to evaluate for possible cholecystitis lithiasis. She had endoscopy planned for less than a week from today, and her symptoms and ongoing for over a month.   ----------------------------------------- 8:25 PM on 03/06/2016 -----------------------------------------  Discussed ultrasound with the patient. She reports she is feeling okay, no evidence of big relief to have a normal ultrasound. She is awake alert in no distress, and I discussed options including performing a CT scan to further evaluate for other causes however in light of her upcoming endoscopy in the chronicity of her symptoms, with shared medical decision making the patient and I agree that she will follow-up with her planned endoscopy and primary care. She return to the emergency room if any new or concerning symptoms arise.  Return precautions and treatment recommendations and follow-up discussed with the patient who is agreeable with the plan.   ____________________________________________   FINAL CLINICAL IMPRESSION(S) / ED DIAGNOSES  Final diagnoses:  Abdominal pain  Epigastric abdominal tenderness on direct palpation      Delman Kitten, MD 03/06/16 2026

## 2016-03-06 NOTE — ED Notes (Signed)
Oral fluids provided to pt

## 2016-03-06 NOTE — ED Notes (Signed)
Pt in xray

## 2016-03-06 NOTE — ED Notes (Signed)
MD at bedside. 

## 2016-03-06 NOTE — ED Notes (Signed)
Pt drank about 37mL of fluid and was able to keep it down

## 2016-03-07 ENCOUNTER — Telehealth: Payer: Self-pay | Admitting: Family Medicine

## 2016-03-07 NOTE — Telephone Encounter (Signed)
Called pt to let her know- that I have called [facility] and they have no openings. They have put her on the high priority cancellation list. She is wondering if Dr. Allen Norris could see her sooner. Can someone call his office later today to determine if they have any space to see her sooner than 6/22?  Thanks! AK

## 2016-03-07 NOTE — Telephone Encounter (Signed)
Called patient and notified that Dr.Wohl's office in booking into July. Patient verbalizes understanding.[place]

## 2016-03-07 NOTE — Telephone Encounter (Signed)
I have referral dated back in April 2017. I inquired about those referrals on 03/04/16.  Dr. Allen Norris would not be able to see this patient any sooner.

## 2016-03-07 NOTE — Telephone Encounter (Signed)
Thanks! Sonya!

## 2016-03-12 ENCOUNTER — Other Ambulatory Visit: Payer: Self-pay | Admitting: Nurse Practitioner

## 2016-03-12 DIAGNOSIS — R1011 Right upper quadrant pain: Secondary | ICD-10-CM

## 2016-03-17 ENCOUNTER — Other Ambulatory Visit: Payer: Self-pay | Admitting: Family Medicine

## 2016-03-20 ENCOUNTER — Other Ambulatory Visit: Payer: Self-pay | Admitting: Family Medicine

## 2016-03-21 ENCOUNTER — Ambulatory Visit
Admission: RE | Admit: 2016-03-21 | Discharge: 2016-03-21 | Disposition: A | Discharge status: Home / Self Care | Payer: Medicaid Other | Source: Ambulatory Visit | Attending: Nurse Practitioner | Admitting: Nurse Practitioner

## 2016-03-21 DIAGNOSIS — R1011 Right upper quadrant pain: Secondary | ICD-10-CM | POA: Insufficient documentation

## 2016-03-21 MED ORDER — TECHNETIUM TC 99M MEBROFENIN IV KIT
5.0000 | PACK | Freq: Once | INTRAVENOUS | Status: AC | PRN
  Administered 2016-03-21: 5.01 via INTRAVENOUS

## 2016-03-22 ENCOUNTER — Ambulatory Visit: Payer: Medicaid Other

## 2016-03-25 ENCOUNTER — Ambulatory Visit
Admission: RE | Admit: 2016-03-25 | Discharge: 2016-03-25 | Disposition: A | Discharge status: Home / Self Care | Payer: Medicaid Other | Source: Ambulatory Visit | Attending: Nurse Practitioner | Admitting: Nurse Practitioner

## 2016-03-25 DIAGNOSIS — R10813 Right lower quadrant abdominal tenderness: Secondary | ICD-10-CM | POA: Diagnosis not present

## 2016-03-25 DIAGNOSIS — R1011 Right upper quadrant pain: Secondary | ICD-10-CM | POA: Diagnosis present

## 2016-03-27 ENCOUNTER — Ambulatory Visit (INDEPENDENT_AMBULATORY_CARE_PROVIDER_SITE_OTHER): Payer: Medicaid Other | Admitting: Family Medicine

## 2016-03-27 ENCOUNTER — Encounter: Payer: Self-pay | Admitting: Family Medicine

## 2016-03-27 VITALS — BP 117/78 | HR 76 | Temp 98.4°F | Resp 16 | Ht 62.0 in | Wt 157.8 lb

## 2016-03-27 DIAGNOSIS — R112 Nausea with vomiting, unspecified: Secondary | ICD-10-CM | POA: Diagnosis not present

## 2016-03-27 DIAGNOSIS — K219 Gastro-esophageal reflux disease without esophagitis: Secondary | ICD-10-CM | POA: Diagnosis not present

## 2016-03-27 DIAGNOSIS — E663 Overweight: Secondary | ICD-10-CM

## 2016-03-27 MED ORDER — ONDANSETRON 4 MG PO TBDP: 4.0000 mg | ORAL_TABLET | Freq: Three times a day (TID) | ORAL | Status: DC | PRN

## 2016-03-27 MED ORDER — PHENTERMINE HCL 37.5 MG PO CAPS: 37.5000 mg | ORAL_CAPSULE | ORAL | Status: DC

## 2016-03-27 NOTE — Assessment & Plan Note (Signed)
Pt will continue to follow with GI. Follow-up in 2 weeks.

## 2016-03-27 NOTE — Patient Instructions (Addendum)
Weight loss: Let's plan to start phentermine 37.5mg  once daily to help with weight loss after you see GI in 2 weeks. I don't want to add anything to your GI distress.  Please monitor your BP at home. If you have chest pain, shortness of breath, or any concerning symptoms from medication- stop immediately and seek medical attention.   Keep taking GERD medication as directed. Try taking Zofran 4mg  ODT with meals and oral pill as you have been taking.

## 2016-03-27 NOTE — Assessment & Plan Note (Signed)
Discussed benefits of starting phentermine vs risks. Plan to start medication after GI follow-up due to recent GI distress and N/V. Pt is amenable. Counseled on diet and exercise. Pt will recheck 4 weeks after starting phentermine for weight and BP check.

## 2016-03-27 NOTE — Progress Notes (Signed)
Subjective:    Patient ID: Karen Kramer, female    DOB: Aug 01, 1981, 36 y.o.   MRN: [id number]  HPI: Karen Kramer is a 35 y.o. female presenting on 03/27/2016 for Weight Check   HPI  Pt presents to talk about weight loss medication. She has had recent weight gain and is unable to lose weight with diet and exercise. Current 157lbs. Was 140 in December. Would like to discuss phentermine. Has tried before with good effect. TSH WNL at last check. No cardiovascular disease or hypertension. Exercise regularly. Eats the GERD diet. Drinks water.  She was recently seen by GI for vomiting and GERD symptoms. Had upper GI and is awaiting follow-up. She is only vomiting once per day at this time- controlled well with Zofran.   Past Medical History  Diagnosis Date  . Asthma   . Depression   . Atypical cervical glandular cells     Current Outpatient Prescriptions on File Prior to Visit  Medication Sig  . buPROPion (WELLBUTRIN SR) 150 MG 12 hr tablet TAKE 1 TABLET (150 MG TOTAL) BY MOUTH 2 (TWO) TIMES DAILY.  . DULoxetine (CYMBALTA) 30 MG capsule Take 1 capsule (30 mg total) by mouth daily.  . DULoxetine (CYMBALTA) 60 MG capsule Take 1 capsule (60 mg total) by mouth daily.  . fluticasone (FLONASE) 50 MCG/ACT nasal spray 2 (TWO) SPRAY, NASAL, TWO TIMES DAILY  . ibuprofen (ADVIL,MOTRIN) 600 MG tablet Take 1 tablet (600 mg total) by mouth every 8 (eight) hours as needed.  . meclizine (ANTIVERT) 32 MG tablet Take 1 tablet (32 mg total) by mouth 3 (three) times daily as needed. (Patient taking differently: Take 32 mg by mouth 3 (three) times daily as needed for dizziness. )  . omeprazole (PRILOSEC) 20 MG capsule Take 1 capsule (20 mg total) by mouth 2 (two) times daily before a meal.  . ondansetron (ZOFRAN) 4 MG tablet Take 1 tablet (4 mg total) by mouth every 8 (eight) hours as needed for nausea or vomiting.  . ranitidine (ZANTAC) 150 MG capsule Take 1 capsule (150 mg total) by mouth every evening.  .  vitamin B-12 (CYANOCOBALAMIN) 50 MCG tablet Take 50 mcg by mouth daily.  . Vitamin D, Ergocalciferol, (DRISDOL) 50000 units CAPS capsule Take 1 capsule (50,000 Units total) by mouth every 7 (seven) days.   No current facility-administered medications on file prior to visit.    Review of Systems  Constitutional: Negative for fever and chills.  HENT: Negative.   Respiratory: Negative for cough, chest tightness and wheezing.   Cardiovascular: Negative for chest pain and leg swelling.  Gastrointestinal: Negative for nausea, vomiting, abdominal pain, diarrhea and constipation.  Endocrine: Negative.  Negative for cold intolerance, heat intolerance, polydipsia, polyphagia and polyuria.  Genitourinary: Negative for dysuria and difficulty urinating.  Musculoskeletal: Negative.   Neurological: Negative for dizziness, light-headedness and numbness.  Psychiatric/Behavioral: Negative.    Per HPI unless specifically indicated above     Objective:    BP 117/78 mmHg  Pulse 76  Temp(Src) 98.4 F (36.9 C) (Oral)  Resp 16  Ht 5\' 2"  (1.575 m)  Wt 157 lb 12.8 oz (71.578 kg)  BMI 28.85 kg/m2  LMP 05/25/2011  Wt Readings from Last 3 Encounters:  03/27/16 157 lb 12.8 oz (71.578 kg)  03/06/16 156 lb (70.761 kg)  03/05/16 156 lb (70.761 kg)    Physical Exam  Constitutional: She is oriented to person, place, and time. She appears well-developed and well-nourished.  HENT:  Head: Normocephalic and  atraumatic.  Neck: Neck supple.  Cardiovascular: Normal rate, regular rhythm and normal heart sounds.  Exam reveals no gallop and no friction rub.   No murmur heard. Pulmonary/Chest: Effort normal and breath sounds normal. She has no wheezes. She exhibits no tenderness.  Abdominal: Soft. Normal appearance and bowel sounds are normal. She exhibits no distension and no mass. There is no tenderness. There is no rebound and no guarding.  Musculoskeletal: Normal range of motion. She exhibits no edema or  tenderness.  Lymphadenopathy:    She has no cervical adenopathy.  Neurological: She is alert and oriented to person, place, and time.  Skin: Skin is warm and dry.   Results for orders placed or performed during the hospital encounter of 03/06/16  Lipase, blood  Result Value Ref Range   Lipase 19 11 - 51 U/L  Comprehensive metabolic panel  Result Value Ref Range   Sodium 142 135 - 145 mmol/L   Potassium 3.8 3.5 - 5.1 mmol/L   Chloride 109 101 - 111 mmol/L   CO2 25 22 - 32 mmol/L   Glucose, Bld 76 65 - 99 mg/dL   BUN 20 6 - 20 mg/dL   Creatinine, Ser 1.01 (H) 0.44 - 1.00 mg/dL   Calcium 9.2 8.9 - 10.3 mg/dL   Total Protein 7.2 6.5 - 8.1 g/dL   Albumin 4.5 3.5 - 5.0 g/dL   AST 17 15 - 41 U/L   ALT 12 (L) 14 - 54 U/L   Alkaline Phosphatase 61 38 - 126 U/L   Total Bilirubin 0.5 0.3 - 1.2 mg/dL   GFR calc non Af Amer >60 >60 mL/min   GFR calc Af Amer >60 >60 mL/min   Anion gap 8 5 - 15  CBC  Result Value Ref Range   WBC 5.7 3.6 - 11.0 K/uL   RBC 4.23 3.80 - 5.20 MIL/uL   Hemoglobin 13.0 12.0 - 16.0 g/dL   HCT 37.5 35.0 - 47.0 %   MCV 88.8 80.0 - 100.0 fL   MCH 30.9 26.0 - 34.0 pg   MCHC 34.8 32.0 - 36.0 g/dL   RDW 12.8 11.5 - 14.5 %   Platelets 219 150 - 440 K/uL      Assessment & Plan:   Problem List Items Addressed This Visit      Digestive   Gastroesophageal reflux disease without esophagitis - Primary    Pt will continue to follow with GI. Follow-up in 2 weeks.       Relevant Medications   ondansetron (ZOFRAN-ODT) 4 MG disintegrating tablet     Other   Overweight    Discussed benefits of starting phentermine vs risks. Plan to start medication after GI follow-up due to recent GI distress and N/V. Pt is amenable. Counseled on diet and exercise. Pt will recheck 4 weeks after starting phentermine for weight and BP check.       Relevant Medications   phentermine 37.5 MG capsule    Other Visit Diagnoses    Non-intractable vomiting with nausea, vomiting of  unspecified type        Change zofran to 4mg  ODT to take with meals.    Relevant Medications    ondansetron (ZOFRAN-ODT) 4 MG disintegrating tablet       Meds ordered this encounter  Medications  . ondansetron (ZOFRAN-ODT) 4 MG disintegrating tablet    Sig: Take 1 tablet (4 mg total) by mouth every 8 (eight) hours as needed for nausea or vomiting.    Dispense:  20 tablet    Refill:  1    Order Specific Question:  Supervising Provider    Answer:  Arlis Porta [id number]  . phentermine 37.5 MG capsule    Sig: Take 1 capsule (37.5 mg total) by mouth every morning.    Dispense:  30 capsule    Refill:  0    Order Specific Question:  Supervising Provider    Answer:  Arlis Porta [id number]      Follow up plan: Return in about 6 weeks (around 05/08/2016) for Weight check. Marland Kitchen

## 2016-04-02 ENCOUNTER — Other Ambulatory Visit: Payer: Self-pay | Admitting: Nurse Practitioner

## 2016-04-02 DIAGNOSIS — R1031 Right lower quadrant pain: Secondary | ICD-10-CM

## 2016-04-05 ENCOUNTER — Other Ambulatory Visit: Payer: Self-pay | Admitting: Family Medicine

## 2016-04-05 ENCOUNTER — Encounter: Payer: Self-pay | Admitting: Family Medicine

## 2016-04-05 MED ORDER — PHENTERMINE HCL 37.5 MG PO TABS: 37.5000 mg | ORAL_TABLET | Freq: Every day | ORAL | Status: DC

## 2016-04-12 ENCOUNTER — Ambulatory Visit
Admission: RE | Admit: 2016-04-12 | Discharge: 2016-04-12 | Disposition: A | Discharge status: Home / Self Care | Payer: Medicaid Other | Source: Ambulatory Visit | Attending: Nurse Practitioner | Admitting: Nurse Practitioner

## 2016-04-12 DIAGNOSIS — R935 Abnormal findings on diagnostic imaging of other abdominal regions, including retroperitoneum: Secondary | ICD-10-CM | POA: Diagnosis present

## 2016-04-12 DIAGNOSIS — R1031 Right lower quadrant pain: Secondary | ICD-10-CM | POA: Diagnosis present

## 2016-04-12 DIAGNOSIS — N8312 Corpus luteum cyst of left ovary: Secondary | ICD-10-CM | POA: Insufficient documentation

## 2016-04-12 MED ORDER — IOPAMIDOL (ISOVUE-300) INJECTION 61%
100.0000 mL | Freq: Once | INTRAVENOUS | Status: AC | PRN
  Administered 2016-04-12: 100 mL via INTRAVENOUS

## 2016-05-07 ENCOUNTER — Encounter: Payer: Self-pay | Admitting: Family Medicine

## 2016-05-07 ENCOUNTER — Other Ambulatory Visit: Payer: Self-pay | Admitting: Family Medicine

## 2016-05-07 DIAGNOSIS — H8112 Benign paroxysmal vertigo, left ear: Secondary | ICD-10-CM

## 2016-05-07 DIAGNOSIS — J0101 Acute recurrent maxillary sinusitis: Secondary | ICD-10-CM

## 2016-05-08 ENCOUNTER — Ambulatory Visit: Payer: Medicaid Other | Admitting: Family Medicine

## 2016-05-08 ENCOUNTER — Encounter: Payer: Self-pay | Admitting: Family Medicine

## 2016-05-13 ENCOUNTER — Ambulatory Visit (INDEPENDENT_AMBULATORY_CARE_PROVIDER_SITE_OTHER): Payer: Medicaid Other | Admitting: Family Medicine

## 2016-05-13 VITALS — BP 130/84 | Temp 98.7°F | Resp 16 | Ht 62.0 in | Wt 160.0 lb

## 2016-05-13 DIAGNOSIS — E663 Overweight: Secondary | ICD-10-CM | POA: Diagnosis not present

## 2016-05-13 DIAGNOSIS — K219 Gastro-esophageal reflux disease without esophagitis: Secondary | ICD-10-CM

## 2016-05-13 MED ORDER — ONDANSETRON HCL 4 MG PO TABS: 4.0000 mg | ORAL_TABLET | Freq: Three times a day (TID) | ORAL | 1 refills | Status: DC | PRN

## 2016-05-13 MED ORDER — PHENTERMINE HCL 37.5 MG PO TABS: 37.5000 mg | ORAL_TABLET | Freq: Every day | ORAL | 0 refills | Status: DC

## 2016-05-13 NOTE — Assessment & Plan Note (Signed)
Continue to follow with GI about source of symptoms. Renewed Zofran for PRN vomiting.

## 2016-05-13 NOTE — Assessment & Plan Note (Signed)
Continue phentermine to help weight loss. Have discussed with patient discontinuing next month if she is unable to lose weight. Reviewed diet and exercise changes needed to help with weight loss.  Recheck 4 weeks.

## 2016-05-13 NOTE — Progress Notes (Signed)
Subjective:    Patient ID: Karen Kramer, female    DOB: 04-02-1981, 35 y.o.   MRN: [id number]  HPI: Karen Kramer is a 35 y.o. female presenting on 05/13/2016 for Weight Check   HPI  Pt presents for weight check. She has been on phentermine for 1 mos. She has lost no weight. Her BP is mildly elevated but she just picked her kids up from school and rushed to get here. She has not been exercising regularly. Diet changes are hard due to on-going nausea/vomiting that is being followed by GI.   Still having vomiting issues. Has had barium swallow and CT scan. Did a have a few weeks without vomiting. Started yesterday. Zofran does help with nausea.   Past Medical History:  Diagnosis Date  . Asthma   . Atypical cervical glandular cells   . Depression     Current Outpatient Prescriptions on File Prior to Visit  Medication Sig  . amoxicillin-clavulanate (AUGMENTIN) 875-125 MG tablet TAKE 1 TABLET BY MOUTH 2 (TWO) TIMES DAILY.  Marland Kitchen buPROPion (WELLBUTRIN SR) 150 MG 12 hr tablet TAKE 1 TABLET (150 MG TOTAL) BY MOUTH 2 (TWO) TIMES DAILY.  . DULoxetine (CYMBALTA) 30 MG capsule Take 1 capsule (30 mg total) by mouth daily.  . DULoxetine (CYMBALTA) 60 MG capsule Take 1 capsule (60 mg total) by mouth daily.  . fluticasone (FLONASE) 50 MCG/ACT nasal spray 2 (TWO) SPRAY, NASAL, TWO TIMES DAILY  . ibuprofen (ADVIL,MOTRIN) 600 MG tablet Take 1 tablet (600 mg total) by mouth every 8 (eight) hours as needed.  . meclizine (ANTIVERT) 32 MG tablet Take 1 tablet (32 mg total) by mouth 3 (three) times daily as needed. (Patient taking differently: Take 32 mg by mouth 3 (three) times daily as needed for dizziness. )  . omeprazole (PRILOSEC) 20 MG capsule Take 1 capsule (20 mg total) by mouth 2 (two) times daily before a meal.  . ondansetron (ZOFRAN-ODT) 4 MG disintegrating tablet Take 1 tablet (4 mg total) by mouth every 8 (eight) hours as needed for nausea or vomiting.  . ranitidine (ZANTAC) 150 MG capsule Take 1  capsule (150 mg total) by mouth every evening.  . vitamin B-12 (CYANOCOBALAMIN) 50 MCG tablet Take 50 mcg by mouth daily.  . Vitamin D, Ergocalciferol, (DRISDOL) 50000 units CAPS capsule Take 1 capsule (50,000 Units total) by mouth every 7 (seven) days.   No current facility-administered medications on file prior to visit.     Review of Systems  Constitutional: Negative for chills and fever.  HENT: Negative.   Respiratory: Negative for cough, chest tightness and wheezing.   Cardiovascular: Negative for chest pain and leg swelling.  Gastrointestinal: Positive for nausea and vomiting. Negative for abdominal pain, constipation and diarrhea.  Endocrine: Negative.  Negative for cold intolerance, heat intolerance, polydipsia, polyphagia and polyuria.  Genitourinary: Negative for difficulty urinating and dysuria.  Musculoskeletal: Negative.   Neurological: Negative for dizziness, light-headedness and numbness.  Psychiatric/Behavioral: Negative.    Per HPI unless specifically indicated above     Objective:    BP 130/84 (BP Location: Left Arm)   Temp 98.7 F (37.1 C) (Oral)   Resp 16   Ht 5\' 2"  (1.575 m)   Wt 160 lb (72.6 kg)   LMP 05/25/2011   BMI 29.26 kg/m   Wt Readings from Last 3 Encounters:  05/13/16 160 lb (72.6 kg)  03/27/16 157 lb 12.8 oz (71.6 kg)  03/06/16 156 lb (70.8 kg)    Physical Exam  Constitutional:  She is oriented to person, place, and time. She appears well-developed and well-nourished.  HENT:  Head: Normocephalic and atraumatic.  Neck: Neck supple.  Cardiovascular: Normal rate, regular rhythm and normal heart sounds.  Exam reveals no gallop and no friction rub.   No murmur heard. Pulmonary/Chest: Effort normal and breath sounds normal. She has no wheezes. She exhibits no tenderness.  Abdominal: Soft. Normal appearance and bowel sounds are normal. She exhibits no distension and no mass. There is no tenderness. There is no rebound and no guarding.    Musculoskeletal: Normal range of motion. She exhibits no edema or tenderness.  Lymphadenopathy:    She has no cervical adenopathy.  Neurological: She is alert and oriented to person, place, and time.  Skin: Skin is warm and dry.   Results for orders placed or performed during the hospital encounter of 03/06/16  Lipase, blood  Result Value Ref Range   Lipase 19 11 - 51 U/L  Comprehensive metabolic panel  Result Value Ref Range   Sodium 142 135 - 145 mmol/L   Potassium 3.8 3.5 - 5.1 mmol/L   Chloride 109 101 - 111 mmol/L   CO2 25 22 - 32 mmol/L   Glucose, Bld 76 65 - 99 mg/dL   BUN 20 6 - 20 mg/dL   Creatinine, Ser 1.01 (H) 0.44 - 1.00 mg/dL   Calcium 9.2 8.9 - 10.3 mg/dL   Total Protein 7.2 6.5 - 8.1 g/dL   Albumin 4.5 3.5 - 5.0 g/dL   AST 17 15 - 41 U/L   ALT 12 (L) 14 - 54 U/L   Alkaline Phosphatase 61 38 - 126 U/L   Total Bilirubin 0.5 0.3 - 1.2 mg/dL   GFR calc non Af Amer >60 >60 mL/min   GFR calc Af Amer >60 >60 mL/min   Anion gap 8 5 - 15  CBC  Result Value Ref Range   WBC 5.7 3.6 - 11.0 K/uL   RBC 4.23 3.80 - 5.20 MIL/uL   Hemoglobin 13.0 12.0 - 16.0 g/dL   HCT 37.5 35.0 - 47.0 %   MCV 88.8 80.0 - 100.0 fL   MCH 30.9 26.0 - 34.0 pg   MCHC 34.8 32.0 - 36.0 g/dL   RDW 12.8 11.5 - 14.5 %   Platelets 219 150 - 440 K/uL      Assessment & Plan:   Problem List Items Addressed This Visit      Digestive   Gastroesophageal reflux disease without esophagitis    Continue to follow with GI about source of symptoms. Renewed Zofran for PRN vomiting.       Relevant Medications   ondansetron (ZOFRAN) 4 MG tablet     Other   Overweight - Primary    Continue phentermine to help weight loss. Have discussed with patient discontinuing next month if she is unable to lose weight. Reviewed diet and exercise changes needed to help with weight loss.  Recheck 4 weeks.        Other Visit Diagnoses   None.     Meds ordered this encounter  Medications  . phentermine  (ADIPEX-P) 37.5 MG tablet    Sig: Take 1 tablet (37.5 mg total) by mouth daily before breakfast.    Dispense:  30 tablet    Refill:  0    Order Specific Question:   Supervising Provider    Answer:   Arlis Porta [phone]  . ondansetron (ZOFRAN) 4 MG tablet    Sig: Take 1  tablet (4 mg total) by mouth every 8 (eight) hours as needed for nausea or vomiting.    Dispense:  20 tablet    Refill:  1    Order Specific Question:   Supervising Provider    Answer:   Arlis Porta [id number]      Follow up plan: Return in about 4 weeks (around 06/10/2016), or if symptoms worsen or fail to improve, for Weight check. Marland Kitchen

## 2016-05-13 NOTE — Patient Instructions (Signed)
We will renew Phentermine today. Continue the medication but ensure you at exercising at least 30 minutes 3-4 times per week.   We will check back in next month to see how your weight is doing.   Please keep an eye on your blood pressure. Check at the pharmacy to ensure it is not higher than 140/90.

## 2016-05-23 ENCOUNTER — Encounter: Payer: Self-pay | Admitting: Family Medicine

## 2016-05-23 ENCOUNTER — Ambulatory Visit (INDEPENDENT_AMBULATORY_CARE_PROVIDER_SITE_OTHER): Payer: Medicaid Other | Admitting: Family Medicine

## 2016-05-23 VITALS — BP 126/90 | HR 88 | Temp 97.5°F | Resp 16 | Ht 62.0 in | Wt 160.0 lb

## 2016-05-23 DIAGNOSIS — R682 Dry mouth, unspecified: Secondary | ICD-10-CM | POA: Diagnosis not present

## 2016-05-23 DIAGNOSIS — IMO0001 Reserved for inherently not codable concepts without codable children: Secondary | ICD-10-CM

## 2016-05-23 DIAGNOSIS — R030 Elevated blood-pressure reading, without diagnosis of hypertension: Secondary | ICD-10-CM | POA: Diagnosis not present

## 2016-05-23 DIAGNOSIS — J0101 Acute recurrent maxillary sinusitis: Secondary | ICD-10-CM | POA: Diagnosis not present

## 2016-05-23 MED ORDER — DM-GUAIFENESIN ER 30-600 MG PO TB12: 1.0000 | ORAL_TABLET | Freq: Two times a day (BID) | ORAL | 0 refills | Status: DC

## 2016-05-23 MED ORDER — PSEUDOEPHEDRINE HCL 30 MG PO TABS: 30.0000 mg | ORAL_TABLET | Freq: Three times a day (TID) | ORAL | 0 refills | Status: DC | PRN

## 2016-05-23 MED ORDER — LEVOFLOXACIN 500 MG PO TABS: 500.0000 mg | ORAL_TABLET | Freq: Every day | ORAL | 0 refills | Status: DC

## 2016-05-23 NOTE — Progress Notes (Signed)
Subjective:    Patient ID: Karen Kramer, female    DOB: Oct 06, 1980, 35 y.o.   MRN: [id number]  HPI: Karen Kramer is a 35 y.o. female presenting on 05/23/2016 for Sinusitis (follow up had some antibitotics but feels like not improved)   HPI  Pt presents for sinus congestion. Recurrent sinus infections are an issue. Recently treated with Augmentin BID for 5 days. Mild improvement. No fevers. Facial pain and pressure. L>R. No ear pain. No chest tightness or trouble breathing.   Pt also having dry mouth on the phentermine. Has tried increasing water with no relief.   Past Medical History:  Diagnosis Date  . Asthma   . Atypical cervical glandular cells   . Depression     Current Outpatient Prescriptions on File Prior to Visit  Medication Sig  . buPROPion (WELLBUTRIN SR) 150 MG 12 hr tablet TAKE 1 TABLET (150 MG TOTAL) BY MOUTH 2 (TWO) TIMES DAILY.  . DULoxetine (CYMBALTA) 30 MG capsule Take 1 capsule (30 mg total) by mouth daily.  . DULoxetine (CYMBALTA) 60 MG capsule Take 1 capsule (60 mg total) by mouth daily.  . fluticasone (FLONASE) 50 MCG/ACT nasal spray 2 (TWO) SPRAY, NASAL, TWO TIMES DAILY  . ibuprofen (ADVIL,MOTRIN) 600 MG tablet Take 1 tablet (600 mg total) by mouth every 8 (eight) hours as needed.  . meclizine (ANTIVERT) 32 MG tablet Take 1 tablet (32 mg total) by mouth 3 (three) times daily as needed. (Patient taking differently: Take 32 mg by mouth 3 (three) times daily as needed for dizziness. )  . omeprazole (PRILOSEC) 20 MG capsule Take 1 capsule (20 mg total) by mouth 2 (two) times daily before a meal.  . ondansetron (ZOFRAN) 4 MG tablet Take 1 tablet (4 mg total) by mouth every 8 (eight) hours as needed for nausea or vomiting.  . ondansetron (ZOFRAN-ODT) 4 MG disintegrating tablet Take 1 tablet (4 mg total) by mouth every 8 (eight) hours as needed for nausea or vomiting.  . phentermine (ADIPEX-P) 37.5 MG tablet Take 1 tablet (37.5 mg total) by mouth daily before  breakfast.  . ranitidine (ZANTAC) 150 MG capsule Take 1 capsule (150 mg total) by mouth every evening.  . vitamin B-12 (CYANOCOBALAMIN) 50 MCG tablet Take 50 mcg by mouth daily.  . Vitamin D, Ergocalciferol, (DRISDOL) 50000 units CAPS capsule Take 1 capsule (50,000 Units total) by mouth every 7 (seven) days.   No current facility-administered medications on file prior to visit.     Review of Systems  Constitutional: Negative for chills and fever.  HENT: Positive for congestion and sinus pressure. Negative for ear pain, sneezing and sore throat.        Dry mouth  Respiratory: Negative for cough, chest tightness and wheezing.   Cardiovascular: Negative for chest pain and palpitations.  Gastrointestinal: Negative.  Negative for diarrhea, nausea and vomiting.  Musculoskeletal: Negative for neck pain and neck stiffness.  Neurological: Positive for headaches. Negative for dizziness.   Per HPI unless specifically indicated above     Objective:    BP 126/90   Pulse 88   Temp 97.5 F (36.4 C) (Oral)   Resp 16   Ht 5\' 2"  (1.575 m)   Wt 160 lb (72.6 kg)   LMP 05/25/2011   SpO2 100%   BMI 29.26 kg/m   Wt Readings from Last 3 Encounters:  05/23/16 160 lb (72.6 kg)  05/13/16 160 lb (72.6 kg)  03/27/16 157 lb 12.8 oz (71.6 kg)  Physical Exam  Constitutional: She is oriented to person, place, and time. She appears well-developed and well-nourished. No distress.  HENT:  Head: Normocephalic and atraumatic.  Right Ear: Hearing and tympanic membrane normal. Tympanic membrane is not erythematous and not bulging.  Left Ear: Hearing and tympanic membrane normal. Tympanic membrane is not erythematous and not bulging.  Nose: Mucosal edema and rhinorrhea present. No sinus tenderness or nasal septal hematoma. Right sinus exhibits no maxillary sinus tenderness and no frontal sinus tenderness. Left sinus exhibits maxillary sinus tenderness and frontal sinus tenderness.  Mouth/Throat: Uvula is  midline and mucous membranes are normal. No uvula swelling. No posterior oropharyngeal edema or posterior oropharyngeal erythema.  Neck: Neck supple. No Brudzinski's sign and no Kernig's sign noted.  Cardiovascular: Normal rate, regular rhythm and normal heart sounds.  Exam reveals no gallop and no friction rub.   No murmur heard. Pulmonary/Chest: Effort normal and breath sounds normal. No accessory muscle usage. No tachypnea. No respiratory distress. She has no wheezes. She exhibits no tenderness.  Abdominal: Soft. Normal appearance and bowel sounds are normal. She exhibits no distension and no mass. There is no tenderness. There is no rebound and no guarding.  Musculoskeletal: Normal range of motion. She exhibits no edema or tenderness.  Lymphadenopathy:    She has no cervical adenopathy.  Neurological: She is alert and oriented to person, place, and time.  Skin: Skin is warm and dry.   Results for orders placed or performed during the hospital encounter of 03/06/16  Lipase, blood  Result Value Ref Range   Lipase 19 11 - 51 U/L  Comprehensive metabolic panel  Result Value Ref Range   Sodium 142 135 - 145 mmol/L   Potassium 3.8 3.5 - 5.1 mmol/L   Chloride 109 101 - 111 mmol/L   CO2 25 22 - 32 mmol/L   Glucose, Bld 76 65 - 99 mg/dL   BUN 20 6 - 20 mg/dL   Creatinine, Ser 1.01 (H) 0.44 - 1.00 mg/dL   Calcium 9.2 8.9 - 10.3 mg/dL   Total Protein 7.2 6.5 - 8.1 g/dL   Albumin 4.5 3.5 - 5.0 g/dL   AST 17 15 - 41 U/L   ALT 12 (L) 14 - 54 U/L   Alkaline Phosphatase 61 38 - 126 U/L   Total Bilirubin 0.5 0.3 - 1.2 mg/dL   GFR calc non Af Amer >60 >60 mL/min   GFR calc Af Amer >60 >60 mL/min   Anion gap 8 5 - 15  CBC  Result Value Ref Range   WBC 5.7 3.6 - 11.0 K/uL   RBC 4.23 3.80 - 5.20 MIL/uL   Hemoglobin 13.0 12.0 - 16.0 g/dL   HCT 37.5 35.0 - 47.0 %   MCV 88.8 80.0 - 100.0 fL   MCH 30.9 26.0 - 34.0 pg   MCHC 34.8 32.0 - 36.0 g/dL   RDW 12.8 11.5 - 14.5 %   Platelets 219 150 -  440 K/uL      Assessment & Plan:   Problem List Items Addressed This Visit    None    Visit Diagnoses    Acute recurrent maxillary sinusitis    -  Primary   Treat with levofloxacin due to treatmetn failure with augmenting. Sudafed PRN. Supportive care at home. Return if not improving.    Relevant Medications   levofloxacin (LEVAQUIN) 500 MG tablet   pseudoephedrine (SUDAFED) 30 MG tablet   dextromethorphan-guaiFENesin (MUCINEX DM) 30-600 MG 12hr tablet   Dry  mouth       Recommend hard candy and biotene mouthwash to help with dry mouth. Likely a side effect of phentermine.    Elevated BP       Normal but borderline high. Monitor closely since patient is on phentermine. Consider adding norvasc to control BP while patient is on phentermine.       Meds ordered this encounter  Medications  . levofloxacin (LEVAQUIN) 500 MG tablet    Sig: Take 1 tablet (500 mg total) by mouth daily.    Dispense:  7 tablet    Refill:  0    Order Specific Question:   Supervising Provider    Answer:   Arlis Porta [id number]  . pseudoephedrine (SUDAFED) 30 MG tablet    Sig: Take 1 tablet (30 mg total) by mouth every 8 (eight) hours as needed for congestion.    Dispense:  30 tablet    Refill:  0    Order Specific Question:   Supervising Provider    Answer:   Arlis Porta [phone]  . dextromethorphan-guaiFENesin (MUCINEX DM) 30-600 MG 12hr tablet    Sig: Take 1 tablet by mouth 2 (two) times daily.    Dispense:  20 tablet    Refill:  0    Order Specific Question:   Supervising Provider    Answer:   Arlis Porta [id number]      Follow up plan: Return if symptoms worsen or fail to improve, for Keep regularly scheduled. Marland Kitchen

## 2016-05-23 NOTE — Patient Instructions (Signed)
You can use supportive care at home to help with your symptoms. I have sent Mucinex DM to your pharmacy to help break up the congestion and soothe your cough. You can takes this twice daily.  Honey is a natural cough suppressant- so add it to your tea in the morning.  If you have a humidifer, set that up in your bedroom at night.   Please seek immediate medical attention if you develop shortness of breath not relieve by inhaler, chest pain/tightness, fever > 103 F or other concerning symptoms.

## 2016-06-27 ENCOUNTER — Ambulatory Visit: Payer: Medicaid Other | Admitting: Family Medicine

## 2016-07-02 ENCOUNTER — Ambulatory Visit (INDEPENDENT_AMBULATORY_CARE_PROVIDER_SITE_OTHER): Payer: Medicaid Other | Admitting: Family Medicine

## 2016-07-02 ENCOUNTER — Encounter: Payer: Self-pay | Admitting: Family Medicine

## 2016-07-02 VITALS — BP 127/84 | HR 130 | Temp 98.6°F | Resp 16 | Ht 62.0 in | Wt 157.0 lb

## 2016-07-02 DIAGNOSIS — R000 Tachycardia, unspecified: Secondary | ICD-10-CM

## 2016-07-02 DIAGNOSIS — J0101 Acute recurrent maxillary sinusitis: Secondary | ICD-10-CM

## 2016-07-02 MED ORDER — AMOXICILLIN-POT CLAVULANATE 875-125 MG PO TABS: 1.0000 | ORAL_TABLET | Freq: Two times a day (BID) | ORAL | 0 refills | Status: DC

## 2016-07-02 MED ORDER — OXYMETAZOLINE HCL 0.05 % NA SOLN: 1.0000 | Freq: Two times a day (BID) | NASAL | 0 refills | Status: DC

## 2016-07-02 MED ORDER — DM-GUAIFENESIN ER 30-600 MG PO TB12: 1.0000 | ORAL_TABLET | Freq: Two times a day (BID) | ORAL | 0 refills | Status: DC

## 2016-07-02 NOTE — Patient Instructions (Signed)
Let's treat for sinus infection today. Try Afrin nasal spray to help with congestion at this time. Because your HR was high- I would avoid lots of sudafed or tylenol sinus at this time.   You can use supportive care at home to help with your symptoms. I have sent Mucinex DM to your pharmacy to help break up the congestion and soothe your cough. You can takes this twice daily.  I have also sent tesslon perles to your pharmacy to help with the cough- you can take these 3 times daily as needed. Honey is a natural cough suppressant- so add it to your tea in the morning.  If you have a humidifer, set that up in your bedroom at night.   Please seek immediate medical attention if you develop shortness of breath not relieve by inhaler, chest pain/tightness, fever > 103 F or other concerning symptoms.

## 2016-07-02 NOTE — Progress Notes (Signed)
Subjective:    Patient ID: Karen Kramer, female    DOB: [birth year], 35 y.o.   MRN: [id number]  HPI: Karen Kramer is a 35 y.o. female presenting on 07/02/2016 for Medication Refill   HPI  Pt presents for medication refill today. She misplaced her last prescription written on 05/13/2016. Has been out of medication since 06/03/2016.  Loss of 3lbs. Of note pt HR is 128 on VS today. Manual recount of 124BPM. No chest pain. No palpitations. She did throw her lunch up 2 hours ago- this is an ongoing issues. No palpitations.  Sinus issues- no fevers at home. 3-4 days of symptoms. L sided facial pressure and swelling.  Stopped up on the L side. Has avoided sudafed at home.   Past Medical History:  Diagnosis Date  . Asthma   . Atypical cervical glandular cells   . Depression     Current Outpatient Prescriptions on File Prior to Visit  Medication Sig  . buPROPion (WELLBUTRIN SR) 150 MG 12 hr tablet TAKE 1 TABLET (150 MG TOTAL) BY MOUTH 2 (TWO) TIMES DAILY.  . DULoxetine (CYMBALTA) 30 MG capsule Take 1 capsule (30 mg total) by mouth daily.  . DULoxetine (CYMBALTA) 60 MG capsule Take 1 capsule (60 mg total) by mouth daily.  . fluticasone (FLONASE) 50 MCG/ACT nasal spray 2 (TWO) SPRAY, NASAL, TWO TIMES DAILY  . ibuprofen (ADVIL,MOTRIN) 600 MG tablet Take 1 tablet (600 mg total) by mouth every 8 (eight) hours as needed.  . meclizine (ANTIVERT) 32 MG tablet Take 1 tablet (32 mg total) by mouth 3 (three) times daily as needed. (Patient taking differently: Take 32 mg by mouth 3 (three) times daily as needed for dizziness. )  . omeprazole (PRILOSEC) 20 MG capsule Take 1 capsule (20 mg total) by mouth 2 (two) times daily before a meal.  . ondansetron (ZOFRAN) 4 MG tablet Take 1 tablet (4 mg total) by mouth every 8 (eight) hours as needed for nausea or vomiting.  . ondansetron (ZOFRAN-ODT) 4 MG disintegrating tablet Take 1 tablet (4 mg total) by mouth every 8 (eight) hours as needed for nausea or  vomiting.  . phentermine (ADIPEX-P) 37.5 MG tablet Take 1 tablet (37.5 mg total) by mouth daily before breakfast.  . ranitidine (ZANTAC) 150 MG capsule Take 1 capsule (150 mg total) by mouth every evening.  . vitamin B-12 (CYANOCOBALAMIN) 50 MCG tablet Take 50 mcg by mouth daily.  . Vitamin D, Ergocalciferol, (DRISDOL) 50000 units CAPS capsule Take 1 capsule (50,000 Units total) by mouth every 7 (seven) days.   No current facility-administered medications on file prior to visit.     Review of Systems  Constitutional: Negative for chills and fever.  HENT: Positive for congestion, facial swelling and sinus pressure. Negative for ear pain, sneezing and sore throat.   Respiratory: Negative for cough, chest tightness and wheezing.   Cardiovascular: Negative for chest pain, palpitations and leg swelling.  Gastrointestinal: Negative.  Negative for diarrhea, nausea and vomiting.  Musculoskeletal: Negative for neck pain and neck stiffness.  Neurological: Positive for headaches.   Per HPI unless specifically indicated above     Objective:    BP 127/84   Pulse (!) 130   Temp 98.6 F (37 C) (Oral)   Resp 16   Ht 5\' 2"  (1.575 m)   Wt 157 lb (71.2 kg)   LMP 05/25/2011   SpO2 100%   BMI 28.72 kg/m   Wt Readings from Last 3 Encounters:  07/02/16 157 lb (  71.2 kg)  05/23/16 160 lb (72.6 kg)  05/13/16 160 lb (72.6 kg)    Physical Exam  Constitutional: She appears well-developed and well-nourished. No distress.  HENT:  Head: Normocephalic.  Right Ear: Tympanic membrane is not erythematous and not bulging.  Left Ear: Tympanic membrane is not erythematous and not bulging.  Nose: Mucosal edema and rhinorrhea present. No sinus tenderness or nasal septal hematoma. Right sinus exhibits no maxillary sinus tenderness and no frontal sinus tenderness. Left sinus exhibits maxillary sinus tenderness and frontal sinus tenderness.  Mouth/Throat: Uvula is midline and mucous membranes are normal. No uvula  swelling. No posterior oropharyngeal edema or posterior oropharyngeal erythema.  Neck: Neck supple. No Brudzinski's sign and no Kernig's sign noted.  Cardiovascular: Regular rhythm, normal heart sounds and normal pulses.  Tachycardia present.  Exam reveals no gallop and no friction rub.   No murmur heard. Pulses:      Radial pulses are 2+ on the right side, and 2+ on the left side.  Pulmonary/Chest: Breath sounds normal. No accessory muscle usage. No tachypnea. No respiratory distress.  Lymphadenopathy:    She has no cervical adenopathy.   Results for orders placed or performed during the hospital encounter of 03/06/16  Lipase, blood  Result Value Ref Range   Lipase 19 11 - 51 U/L  Comprehensive metabolic panel  Result Value Ref Range   Sodium 142 135 - 145 mmol/L   Potassium 3.8 3.5 - 5.1 mmol/L   Chloride 109 101 - 111 mmol/L   CO2 25 22 - 32 mmol/L   Glucose, Bld 76 65 - 99 mg/dL   BUN 20 6 - 20 mg/dL   Creatinine, Ser 1.01 (H) 0.44 - 1.00 mg/dL   Calcium 9.2 8.9 - 10.3 mg/dL   Total Protein 7.2 6.5 - 8.1 g/dL   Albumin 4.5 3.5 - 5.0 g/dL   AST 17 15 - 41 U/L   ALT 12 (L) 14 - 54 U/L   Alkaline Phosphatase 61 38 - 126 U/L   Total Bilirubin 0.5 0.3 - 1.2 mg/dL   GFR calc non Af Amer >60 >60 mL/min   GFR calc Af Amer >60 >60 mL/min   Anion gap 8 5 - 15  CBC  Result Value Ref Range   WBC 5.7 3.6 - 11.0 K/uL   RBC 4.23 3.80 - 5.20 MIL/uL   Hemoglobin 13.0 12.0 - 16.0 g/dL   HCT 37.5 35.0 - 47.0 %   MCV 88.8 80.0 - 100.0 fL   MCH 30.9 26.0 - 34.0 pg   MCHC 34.8 32.0 - 36.0 g/dL   RDW 12.8 11.5 - 14.5 %   Platelets 219 150 - 440 K/uL      Assessment & Plan:   Problem List Items Addressed This Visit    None    Visit Diagnoses    Tachycardia    -  Primary   Likely multifactorial- anxiety and infection. ECG WNL aside from 104 rate. Hold phentermine at this time due to heart rate. Avoid sudafed. Alarm symptoms reviewed. Recheck 1 week for HR rate check.    Relevant  Orders   EKG 12-Lead   Acute recurrent maxillary sinusitis       Treat for sinus infection today 2/2 HR and symptoms. Augmentin BID for 7 days. Supportive care at home. Alarm symptoms reviewed.    Relevant Medications   dextromethorphan-guaiFENesin (MUCINEX DM) 30-600 MG 12hr tablet   oxymetazoline (AFRIN NASAL SPRAY) 0.05 % nasal spray   amoxicillin-clavulanate (AUGMENTIN)  875-125 MG tablet      Meds ordered this encounter  Medications  . dextromethorphan-guaiFENesin (MUCINEX DM) 30-600 MG 12hr tablet    Sig: Take 1 tablet by mouth 2 (two) times daily.    Dispense:  20 tablet    Refill:  0    Order Specific Question:   Supervising Provider    Answer:   Arlis Porta [phone]  . oxymetazoline (AFRIN NASAL SPRAY) 0.05 % nasal spray    Sig: Place 1 spray into both nostrils 2 (two) times daily.    Dispense:  30 mL    Refill:  0    Order Specific Question:   Supervising Provider    Answer:   Arlis Porta [id number]  . amoxicillin-clavulanate (AUGMENTIN) 875-125 MG tablet    Sig: Take 1 tablet by mouth 2 (two) times daily.    Dispense:  14 tablet    Refill:  0    Order Specific Question:   Supervising Provider    Answer:   Arlis Porta [id number]      Follow up plan: Return in about 1 week (around 07/09/2016), or if symptoms worsen or fail to improve, for HR check. Marland Kitchen

## 2016-07-08 ENCOUNTER — Other Ambulatory Visit: Payer: Self-pay | Admitting: Family Medicine

## 2016-07-08 DIAGNOSIS — J0101 Acute recurrent maxillary sinusitis: Secondary | ICD-10-CM

## 2016-07-11 ENCOUNTER — Other Ambulatory Visit: Payer: Self-pay | Admitting: Family Medicine

## 2016-07-11 ENCOUNTER — Ambulatory Visit (INDEPENDENT_AMBULATORY_CARE_PROVIDER_SITE_OTHER): Payer: Medicaid Other | Admitting: Family Medicine

## 2016-07-11 VITALS — BP 118/83 | HR 99 | Temp 98.6°F | Resp 16 | Ht 62.0 in | Wt 158.0 lb

## 2016-07-11 DIAGNOSIS — E663 Overweight: Secondary | ICD-10-CM | POA: Diagnosis not present

## 2016-07-11 DIAGNOSIS — J329 Chronic sinusitis, unspecified: Secondary | ICD-10-CM

## 2016-07-11 DIAGNOSIS — K219 Gastro-esophageal reflux disease without esophagitis: Secondary | ICD-10-CM

## 2016-07-11 MED ORDER — DULOXETINE HCL 60 MG PO CPEP: 60.0000 mg | ORAL_CAPSULE | Freq: Every day | ORAL | 3 refills | Status: DC

## 2016-07-11 MED ORDER — LEVOFLOXACIN 500 MG PO TABS: 500.0000 mg | ORAL_TABLET | Freq: Every day | ORAL | 0 refills | Status: DC

## 2016-07-11 MED ORDER — DULOXETINE HCL 30 MG PO CPEP: 30.0000 mg | ORAL_CAPSULE | Freq: Every day | ORAL | 3 refills | Status: DC

## 2016-07-11 NOTE — Assessment & Plan Note (Signed)
At this time I advised pt against continued use of phentermine. She has had little success on the medication. Reviewed tips and tricks for weight loss. Regular exercise. Modified low carb diet. Referred to choose my plate to help with food choices.

## 2016-07-11 NOTE — Patient Instructions (Signed)
We will treat the sinus infection with levofloxacin again. We will make you an appt with [facility] to determine why you have such frequent sinusitis.

## 2016-07-11 NOTE — Progress Notes (Signed)
Subjective:    Patient ID: Karen Kramer, female    DOB: [birth year], 35 y.o.   MRN: [id number]  HPI: Karen Kramer is a 35 y.o. female presenting on 07/11/2016 for Tachycardia   HPI  Pt presents for follow-up of tachycardia. HR was in the 120's last week. She also had sinus infection. HR today is ithe 100's. No chest pain or shortness of breath. She was previously on phentermine for weight loss but has not taken since September 11. She does endorse drinking large amounts of caffeine. Sinusitis symptoms have not improved. Completed Augmentin today. Persistent facial pain and pressure on the L side. Still congested. Home treatment is sudafed, neti-pot and mucinex.  Saw GI for episodes of emesis, GERD, and dysphagia- last seen in June. Thought to be GERD. All tests were negative. Symptoms were improving. Last episode of emesis was 1 weeks. She has not had recent follow-up. Is able to keep food and fluids down.   Past Medical History:  Diagnosis Date  . Asthma   . Atypical cervical glandular cells   . Depression     Current Outpatient Prescriptions on File Prior to Visit  Medication Sig  . buPROPion (WELLBUTRIN SR) 150 MG 12 hr tablet TAKE 1 TABLET (150 MG TOTAL) BY MOUTH 2 (TWO) TIMES DAILY.  . DULoxetine (CYMBALTA) 30 MG capsule Take 1 capsule (30 mg total) by mouth daily.  . DULoxetine (CYMBALTA) 60 MG capsule Take 1 capsule (60 mg total) by mouth daily.  . fluticasone (FLONASE) 50 MCG/ACT nasal spray 2 (TWO) SPRAY, NASAL, TWO TIMES DAILY  . ibuprofen (ADVIL,MOTRIN) 600 MG tablet Take 1 tablet (600 mg total) by mouth every 8 (eight) hours as needed.  . meclizine (ANTIVERT) 32 MG tablet Take 1 tablet (32 mg total) by mouth 3 (three) times daily as needed. (Patient taking differently: Take 32 mg by mouth 3 (three) times daily as needed for dizziness. )  . omeprazole (PRILOSEC) 20 MG capsule Take 1 capsule (20 mg total) by mouth 2 (two) times daily before a meal.  . ondansetron (ZOFRAN)  4 MG tablet Take 1 tablet (4 mg total) by mouth every 8 (eight) hours as needed for nausea or vomiting.  Marland Kitchen oxymetazoline (AFRIN NASAL SPRAY) 0.05 % nasal spray Place 1 spray into both nostrils 2 (two) times daily.  . ranitidine (ZANTAC) 150 MG capsule Take 1 capsule (150 mg total) by mouth every evening.  . vitamin B-12 (CYANOCOBALAMIN) 50 MCG tablet Take 50 mcg by mouth daily.  . Vitamin D, Ergocalciferol, (DRISDOL) 50000 units CAPS capsule Take 1 capsule (50,000 Units total) by mouth every 7 (seven) days.   No current facility-administered medications on file prior to visit.     Review of Systems  Constitutional: Negative for chills.  HENT: Positive for congestion and sinus pressure. Negative for ear pain, sneezing, sore throat and voice change.   Respiratory: Negative for cough, chest tightness and wheezing.   Cardiovascular: Negative for chest pain, palpitations and leg swelling.  Gastrointestinal: Positive for vomiting (occasional. ). Negative for diarrhea and nausea.  Musculoskeletal: Negative for neck pain and neck stiffness.  Neurological: Positive for headaches.   Per HPI unless specifically indicated above     Objective:    BP 118/83   Pulse 99   Temp 98.6 F (37 C) (Oral)   Resp 16   Ht 5\' 2"  (1.575 m)   Wt 158 lb (71.7 kg)   LMP 05/25/2011   BMI 28.90 kg/m   Wt Readings from  Last 3 Encounters:  07/11/16 158 lb (71.7 kg)  07/02/16 157 lb (71.2 kg)  05/23/16 160 lb (72.6 kg)    Physical Exam  Constitutional: She is oriented to person, place, and time. She appears well-developed and well-nourished.  HENT:  Head: Normocephalic and atraumatic.  Right Ear: Hearing and tympanic membrane normal.  Left Ear: Hearing and tympanic membrane normal.  Nose: Mucosal edema present. No rhinorrhea. Right sinus exhibits no maxillary sinus tenderness and no frontal sinus tenderness. Left sinus exhibits maxillary sinus tenderness and frontal sinus tenderness.  Mouth/Throat: Uvula  is midline, oropharynx is clear and moist and mucous membranes are normal.  Neck: Neck supple.  Cardiovascular: Normal rate, regular rhythm and normal heart sounds.  Exam reveals no gallop and no friction rub.   No murmur heard. Pulmonary/Chest: Effort normal and breath sounds normal. She has no wheezes. She exhibits no tenderness.  Abdominal: Soft. Normal appearance and bowel sounds are normal. She exhibits no distension and no mass. There is no tenderness. There is no rebound and no guarding.  Musculoskeletal: Normal range of motion. She exhibits no edema or tenderness.  Lymphadenopathy:    She has no cervical adenopathy.  Neurological: She is alert and oriented to person, place, and time.  Skin: Skin is warm and dry.   Results for orders placed or performed during the hospital encounter of 03/06/16  Lipase, blood  Result Value Ref Range   Lipase 19 11 - 51 U/L  Comprehensive metabolic panel  Result Value Ref Range   Sodium 142 135 - 145 mmol/L   Potassium 3.8 3.5 - 5.1 mmol/L   Chloride 109 101 - 111 mmol/L   CO2 25 22 - 32 mmol/L   Glucose, Bld 76 65 - 99 mg/dL   BUN 20 6 - 20 mg/dL   Creatinine, Ser 1.01 (H) 0.44 - 1.00 mg/dL   Calcium 9.2 8.9 - 10.3 mg/dL   Total Protein 7.2 6.5 - 8.1 g/dL   Albumin 4.5 3.5 - 5.0 g/dL   AST 17 15 - 41 U/L   ALT 12 (L) 14 - 54 U/L   Alkaline Phosphatase 61 38 - 126 U/L   Total Bilirubin 0.5 0.3 - 1.2 mg/dL   GFR calc non Af Amer >60 >60 mL/min   GFR calc Af Amer >60 >60 mL/min   Anion gap 8 5 - 15  CBC  Result Value Ref Range   WBC 5.7 3.6 - 11.0 K/uL   RBC 4.23 3.80 - 5.20 MIL/uL   Hemoglobin 13.0 12.0 - 16.0 g/dL   HCT 37.5 35.0 - 47.0 %   MCV 88.8 80.0 - 100.0 fL   MCH 30.9 26.0 - 34.0 pg   MCHC 34.8 32.0 - 36.0 g/dL   RDW 12.8 11.5 - 14.5 %   Platelets 219 150 - 440 K/uL      Assessment & Plan:   Problem List Items Addressed This Visit      Digestive   Gastroesophageal reflux disease without esophagitis    Still having  intermittent dysphagia. Advised pt to continue current medications and call GI for follow-up appt as needed.         Other   Overweight    At this time I advised pt against continued use of phentermine. She has had little success on the medication. Reviewed tips and tricks for weight loss. Regular exercise. Modified low carb diet. Referred to choose my plate to help with food choices.  Other Visit Diagnoses    Recurrent sinus infections    -  Primary   Treat with levofloxacin today for treatment failure with augmentin. This is a chronic issue with patient. discussed referral to ENT. Pt is amenable. Placed toda   Relevant Medications   levofloxacin (LEVAQUIN) 500 MG tablet   Other Relevant Orders   Ambulatory referral to ENT      Meds ordered this encounter  Medications  . levofloxacin (LEVAQUIN) 500 MG tablet    Sig: Take 1 tablet (500 mg total) by mouth daily.    Dispense:  7 tablet    Refill:  0    Order Specific Question:   Supervising Provider    Answer:   Arlis Porta [id number]      Follow up plan: Return in about 3 months (around 10/11/2016), or if symptoms worsen or fail to improve, for Depression. Marland Kitchen

## 2016-07-11 NOTE — Assessment & Plan Note (Signed)
Still having intermittent dysphagia. Advised pt to continue current medications and call GI for follow-up appt as needed.

## 2016-10-01 ENCOUNTER — Other Ambulatory Visit: Payer: Self-pay | Admitting: Family Medicine

## 2016-10-01 DIAGNOSIS — E559 Vitamin D deficiency, unspecified: Secondary | ICD-10-CM

## 2016-10-01 MED ORDER — VITAMIN D3 50 MCG (2000 UT) PO CAPS: 2000.0000 [IU] | ORAL_CAPSULE | Freq: Every day | ORAL | 11 refills | Status: DC

## 2016-10-01 NOTE — Telephone Encounter (Signed)
Prior vitamin D deficiency on lab 01/2016, level 17, placed on Vitamin D3 50,000 units weekly for 12 weeks, seems to have been on this for longer. Declined refill for Vitamin D3 50k unit capsules again, since has completed therapy. Sent new rx to [facility] for Vitamin D3 2,000 unit capsule daily for maintenance, until follows-up and re-check vitamin D level  Nobie Putnam, DO [facility] 10/01/2016, 6:01 PM

## 2016-10-04 ENCOUNTER — Ambulatory Visit (INDEPENDENT_AMBULATORY_CARE_PROVIDER_SITE_OTHER): Payer: Medicaid Other | Admitting: Family Medicine

## 2016-10-04 ENCOUNTER — Encounter: Payer: Self-pay | Admitting: Family Medicine

## 2016-10-04 VITALS — BP 127/80 | HR 87 | Temp 98.7°F | Resp 16 | Ht 62.0 in | Wt 164.8 lb

## 2016-10-04 DIAGNOSIS — R6889 Other general symptoms and signs: Secondary | ICD-10-CM

## 2016-10-04 DIAGNOSIS — J209 Acute bronchitis, unspecified: Secondary | ICD-10-CM

## 2016-10-04 DIAGNOSIS — R509 Fever, unspecified: Secondary | ICD-10-CM | POA: Diagnosis not present

## 2016-10-04 DIAGNOSIS — Z20828 Contact with and (suspected) exposure to other viral communicable diseases: Secondary | ICD-10-CM | POA: Diagnosis not present

## 2016-10-04 LAB — POCT INFLUENZA A/B
INFLUENZA A, POC: NEGATIVE
INFLUENZA B, POC: NEGATIVE

## 2016-10-04 MED ORDER — AZITHROMYCIN 250 MG PO TABS: ORAL_TABLET | ORAL | 0 refills | Status: DC

## 2016-10-04 MED ORDER — OSELTAMIVIR PHOSPHATE 75 MG PO CAPS: 75.0000 mg | ORAL_CAPSULE | Freq: Two times a day (BID) | ORAL | 0 refills | Status: DC

## 2016-10-04 NOTE — Patient Instructions (Signed)
Thank you for coming in to clinic today.  1. It sounds like you had an Upper Respiratory Virus that has settled into a Bronchitis, lower respiratory tract infection. I don't have concerns for pneumonia today, and think that this should gradually improve. Once you are feeling better, the cough may take a few weeks to fully resolve. I do hear wheezing and coarse breath sounds, this may be due to the virus - FLU TEST NEGATIVE TODAY (occasionally this can miss the diagnosis) - Rx for Tamiflu given, take 1 capsule TWICE a day for 5 days ONLY IF Symptoms of flu get worse (persistent fevers, worsening muscle aches, nausea, vomiting) OR you can take 1 capsule daily for 5 days for PREVENTATIVE exposure dose  Start Azithromycin Z pak (antibiotic) 2 tabs day 1, then 1 tab x 4 days, complete entire course even if improved - Start Loratadine (Claritin) 10mg  daily and Continue Flonase 2 sprays in each nostril daily for next 4-6 weeks, then you may stop and use seasonally or as needed - Start OTC Mucinex (regular without additives) for 1 week or less, to help clear the mucus - Use nasal saline (Simply Saline or Ocean Spray) to flush nasal congestion multiple times a day, may help cough - Drink plenty of fluids to improve congestion  Please schedule a follow-up appointment with Dr. Parks Ranger in 1-2 weeks as needed for bronchitis if not improving  If you have any other questions or concerns, please feel free to call the clinic or send a message through [facility]. You may also schedule an earlier appointment if necessary.  Nobie Putnam, DO [facility]

## 2016-10-04 NOTE — Progress Notes (Signed)
Subjective:    Patient ID: Karen Kramer, female    DOB: 10-08-1980, 36 y.o.   MRN: [id number]  Karen Kramer is a 36 y.o. female presenting on 10/04/2016 for Influenza  Patient presents for a same day appointment.  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 Reports symptoms started earlier this week with nasal congestion and URI symptoms, then progressed to cough over past 24 hours, seems to be worsening in deeper chest congestion, thicker in morning and productive with a purulent smell - did not receive influenza vaccine this season - Took Ibuprofen 200mg  x 4 tabs 4-6 hours as needed, last dose 1.5 hours prior to arrival today - Previously saw ENT with recurrent sinus infections, had CT, started on Flonase and Aszetline, [facility] - Admits low grade temp, generalized myalgias (improve with ibuprofen) - Denies any chills or sweats, nausea, vomiting, diarrhea, abdominal   Social History  Substance Use Topics  . Smoking status: Never Smoker  . Smokeless tobacco: Not on file  . Alcohol use Yes     Comment: "Once every few months"    Review of Systems Per HPI unless specifically indicated above     Objective:    BP 127/80 (BP Location: Right Arm, Patient Position: Sitting, Cuff Size: Normal)   Pulse 87   Temp 98.7 F (37.1 C) (Oral)   Resp 16   Ht 5\' 2"  (1.575 m)   Wt 164 lb 12.8 oz (74.8 kg)   LMP 05/25/2011   BMI 30.14 kg/m   Wt Readings from Last 3 Encounters:  10/04/16 164 lb 12.8 oz (74.8 kg)  07/11/16 158 lb (71.7 kg)  07/02/16 157 lb (71.2 kg)    Physical Exam  Constitutional: She appears well-developed and well-nourished. No distress.  Well-appearing, comfortable, cooperative  HENT:  Head: Normocephalic and atraumatic.  Mouth/Throat: Oropharynx is clear and moist.  Frontal / maxillary sinuses non-tender. Nares patent  without purulence or edema. Bilateral TMs clear without erythema, effusion or bulging. Oropharynx clear without erythema, exudates, edema or asymmetry.  Eyes: Conjunctivae are normal. Right eye exhibits no discharge. Left eye exhibits no discharge.  Neck: Normal range of motion. Neck supple.  Cardiovascular: Normal rate, regular rhythm, normal heart sounds and intact distal pulses.   No murmur heard. Pulmonary/Chest: Effort normal and breath sounds normal. No respiratory distress. She has no wheezes. She has no rales.  Musculoskeletal: She exhibits no edema or tenderness.  Lymphadenopathy:    She has no cervical adenopathy.  Neurological: She is alert.  Skin: Skin is warm and dry. No rash noted. She is not diaphoretic. No erythema.  Psychiatric: Her behavior is normal.  Nursing note and vitals reviewed.  Results for orders placed or performed in visit on 10/04/16  POCT Influenza A/B  Result Value Ref Range   Influenza A, POC Negative Negative   Influenza B, POC Negative Negative      Assessment & Plan:   Problem List Items Addressed This Visit    None    Visit Diagnoses    Acute bronchitis, unspecified organism    -  Primary  Most consistent with worsening bronchitis in setting of likely viral URI (+sick contacts) however positive flu exposure, but she is rapid flu test negative today. - Afebrile (did take ibuprofen recently), no focal signs of infection (not consistent with pneumonia by history or exam), no evidence sinusitis.  Mild coarse breath sounds on exam  Plan: 1. Start empiric coverage Azithromycin Z-pak dosing 500mg  then 250mg  daily x 4 days 2. Start Loratadine (Claritin) 10mg  daily and Flonase 2 sprays in each nostril daily for next 4-6 weeks, then may stop and use seasonally or as needed 3. Recommend trial OTC - Mucinex, Tylenol/Ibuprofen PRN, Nasal saline, lozenges, tea with honey/lemon 4. Return criteria reviewed, follow-up within 1 week if not improved      Relevant Medications   azithromycin (ZITHROMAX Z-PAK) 250 MG tablet   Flu-like symptoms     - Negative rapid flu today, but multiple positive exposures - Clinically less consistent with flu but some myalgias - Given rx Tamiflu 75mg  BID for consideration, may start preventative dose now, or if worsening flu like symptoms in 24 hours go ahead and start BID dosing     Relevant Medications   oseltamivir (TAMIFLU) 75 MG capsule   Other Relevant Orders   POCT Influenza A/B (Completed)   Low grade fever       Relevant Orders   POCT Influenza A/B (Completed)   Exposure to the flu       Relevant Medications   oseltamivir (TAMIFLU) 75 MG capsule      Meds ordered this encounter  Medications  . Vitamin D, Ergocalciferol, (DRISDOL) 50000 units CAPS capsule    Sig: Take 1 capsule by mouth once a week.    Refill:  1  . azelastine (ASTELIN) 0.1 % nasal spray    Sig: Place 2 sprays into both nostrils daily.    Refill:  12  . azithromycin (ZITHROMAX Z-PAK) 250 MG tablet    Sig: Take 2 tabs (500mg  total) on Day 1. Take 1 tab (250mg ) daily for next 4 days.    Dispense:  6 tablet    Refill:  0  . oseltamivir (TAMIFLU) 75 MG capsule    Sig: Take 1 capsule (75 mg total) by mouth 2 (two) times daily. For 5 days    Dispense:  10 capsule    Refill:  0      Follow up plan: Return in about 2 weeks (around 10/18/2016), or if symptoms worsen or fail to improve, for bronchitis.  Nobie Putnam, [facility] 10/05/2016, 11:07 AM

## 2016-10-18 ENCOUNTER — Other Ambulatory Visit: Payer: Self-pay | Admitting: Family Medicine

## 2016-10-18 MED ORDER — BUPROPION HCL ER (SR) 150 MG PO TB12: ORAL_TABLET | ORAL | 5 refills | Status: DC

## 2016-11-01 ENCOUNTER — Other Ambulatory Visit: Payer: Self-pay | Admitting: Family Medicine

## 2016-11-01 DIAGNOSIS — R6889 Other general symptoms and signs: Secondary | ICD-10-CM

## 2016-11-01 DIAGNOSIS — Z20828 Contact with and (suspected) exposure to other viral communicable diseases: Secondary | ICD-10-CM

## 2017-01-07 ENCOUNTER — Other Ambulatory Visit: Payer: Self-pay | Admitting: Family Medicine

## 2017-01-07 DIAGNOSIS — K219 Gastro-esophageal reflux disease without esophagitis: Secondary | ICD-10-CM

## 2017-01-07 MED ORDER — ONDANSETRON HCL 4 MG PO TABS: 4.0000 mg | ORAL_TABLET | Freq: Three times a day (TID) | ORAL | 1 refills | Status: DC | PRN

## 2017-02-14 ENCOUNTER — Other Ambulatory Visit: Payer: Self-pay

## 2017-03-28 ENCOUNTER — Other Ambulatory Visit: Payer: Self-pay

## 2017-03-28 DIAGNOSIS — K219 Gastro-esophageal reflux disease without esophagitis: Secondary | ICD-10-CM

## 2017-03-28 MED ORDER — OMEPRAZOLE 20 MG PO CPDR: 20.0000 mg | DELAYED_RELEASE_CAPSULE | Freq: Two times a day (BID) | ORAL | 0 refills | Status: DC

## 2017-03-28 MED ORDER — RANITIDINE HCL 150 MG PO CAPS: 150.0000 mg | ORAL_CAPSULE | Freq: Every evening | ORAL | 2 refills | Status: DC

## 2017-05-06 ENCOUNTER — Other Ambulatory Visit: Payer: Self-pay | Admitting: Family Medicine

## 2017-05-06 DIAGNOSIS — K219 Gastro-esophageal reflux disease without esophagitis: Secondary | ICD-10-CM

## 2017-06-25 ENCOUNTER — Other Ambulatory Visit: Payer: Self-pay | Admitting: Family Medicine

## 2017-06-25 DIAGNOSIS — F329 Major depressive disorder, single episode, unspecified: Secondary | ICD-10-CM

## 2017-06-25 DIAGNOSIS — F419 Anxiety disorder, unspecified: Principal | ICD-10-CM

## 2017-06-27 DIAGNOSIS — J301 Allergic rhinitis due to pollen: Secondary | ICD-10-CM | POA: Insufficient documentation

## 2017-12-16 ENCOUNTER — Other Ambulatory Visit: Payer: Self-pay

## 2017-12-16 DIAGNOSIS — K219 Gastro-esophageal reflux disease without esophagitis: Secondary | ICD-10-CM

## 2017-12-16 MED ORDER — OMEPRAZOLE 20 MG PO CPDR: 20.0000 mg | DELAYED_RELEASE_CAPSULE | Freq: Two times a day (BID) | ORAL | 0 refills | Status: AC

## 2018-02-14 IMAGING — NM NM HEPATO W/GB/PHARM/[PERSON_NAME]
3 series · 18 of 18 positions shown · non-contrast
Comparison: None

CLINICAL DATA: RIGHT upper quadrant pain for several months with
nausea and vomiting daily after eating

EXAM:
NUCLEAR MEDICINE HEPATOBILIARY IMAGING WITH GALLBLADDER EF
TECHNIQUE: Sequential images of the abdomen were obtained [DATE] minutes
following intravenous administration of radiopharmaceutical. After
oral ingestion of Ensure, gallbladder ejection fraction was
determined. At 60 min, normal ejection fraction is greater than 33%.
RADIOPHARMACEUTICALS:  5.01 mCi 1c-CCm  Choletec IV

[Series 1000: gallbladder ef dynamic · 4.80mm/px · 6 of 120 frames shown]
[frame 11/120]
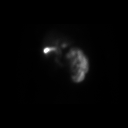
[frame 31/120]
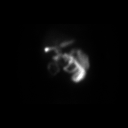
[frame 51/120]
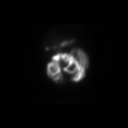
[frame 71/120]
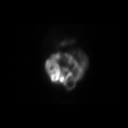
[frame 91/120]
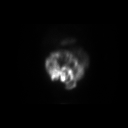
[frame 111/120]
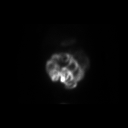

[Series 1000: gallbladder ef dynamic (results) · 4.80mm/px · 6 of 120 frames shown]
[frame 11/120]
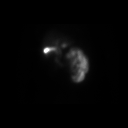
[frame 31/120]
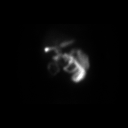
[frame 51/120]
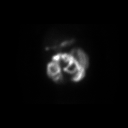
[frame 71/120]
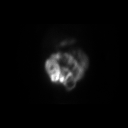
[frame 91/120]
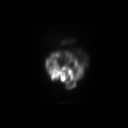
[frame 111/120]
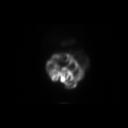

[Series 1000: hepatobiliary dynamic · 9.59mm/px · 6 of 60 frames shown]
[frame 6/60]
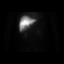
[frame 16/60]
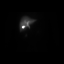
[frame 26/60]
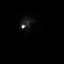
[frame 36/60]
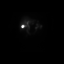
[frame 46/60]
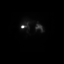
[frame 56/60]
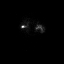

[18 of 18 positions shown; findings below may reference images not displayed]

FINDINGS: Normal tracer extraction from bloodstream indicating normal
hepatocellular function.

Normal excretion of tracer into biliary tree.

Gallbladder visualized at 10 min.

Small bowel visualized at 28 min.

No hepatic retention of tracer.

Subjectively normal emptying of tracer from gallbladder following
fatty meal stimulation.

Calculated gallbladder ejection fraction is 98%, normal.

Patient reported no symptoms following Ensure ingestion.

Normal gallbladder ejection fraction following Ensure ingestion is
greater than 33% at 1 hour.
IMPRESSION: Normal exam.

## 2018-03-08 IMAGING — CT CT ABD-PELV W/ CM
1 of 2 series · 15 of 32 positions shown, 19 images · IV contrast (APPLIED)
Comparison: None.

CLINICAL DATA: Chronic right lower quadrant pain for 1 year.
Nausea. Constipation.

EXAM:
CT ABDOMEN AND PELVIS WITH CONTRAST
TECHNIQUE: Multidetector CT imaging of the abdomen and pelvis was performed
using the standard protocol following bolus administration of
intravenous contrast.
CONTRAST:  100mL [id number] IOPAMIDOL ([id number]) INJECTION 61%

[Series 2: axial st · axial · 0.70mm/px · z∈[-1083,-658]mm · 15 of 93 slices shown, 19 images]
[im 4/93  soft-tissue]
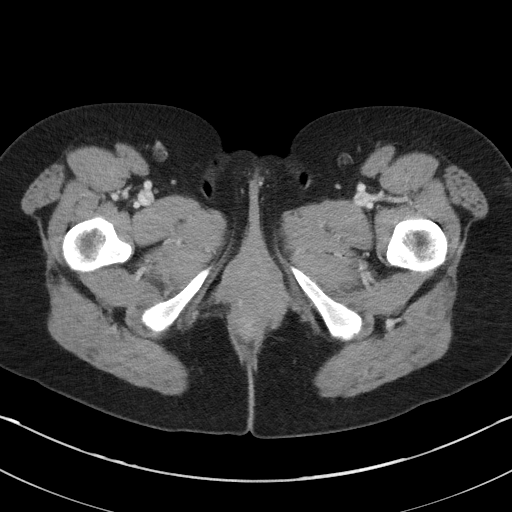
[im 4/93  bone]
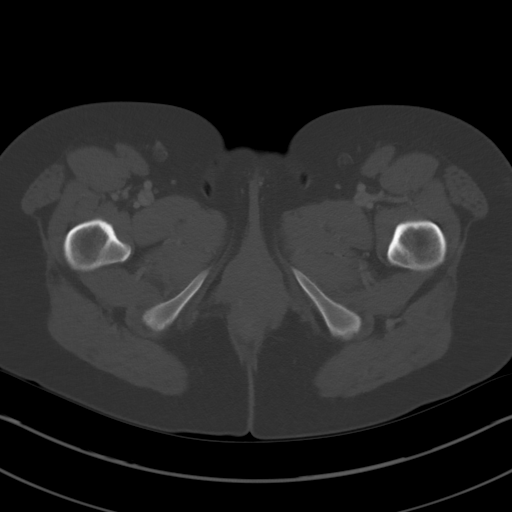
[im 12/93  soft-tissue]
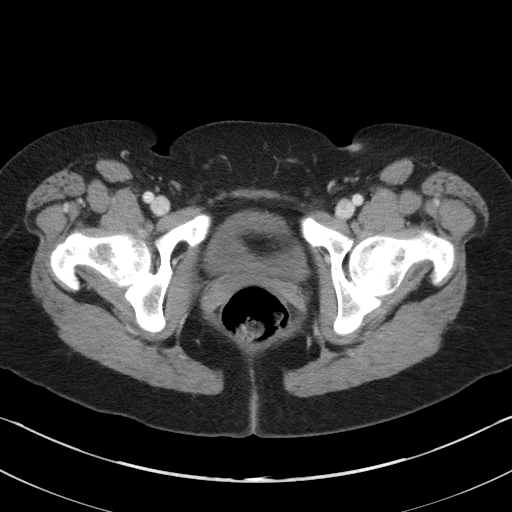
[im 20/93  soft-tissue]
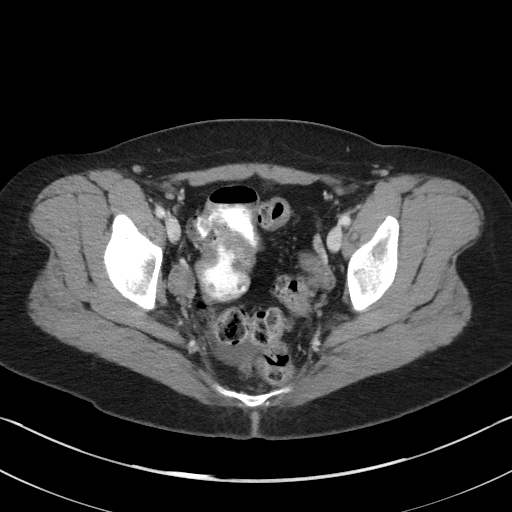
[im 27/93  soft-tissue]
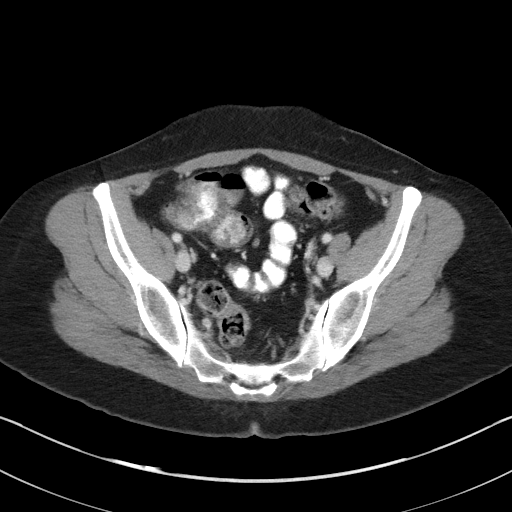
[im 31/93  soft-tissue]
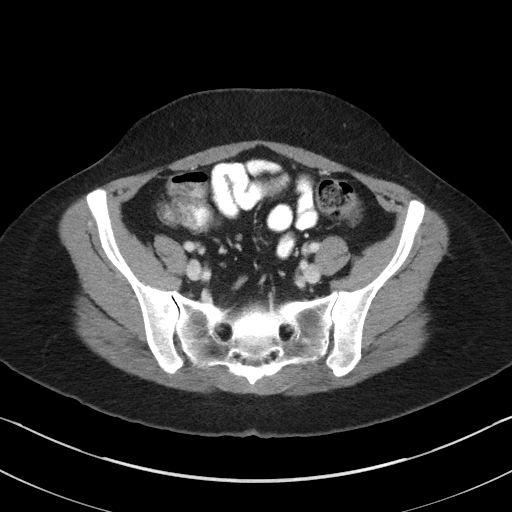
[im 39/93  soft-tissue]
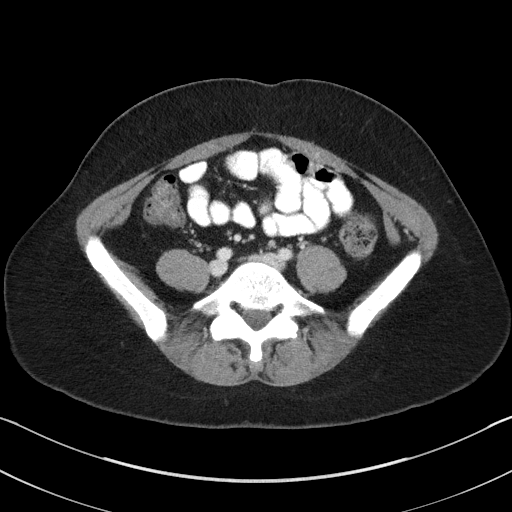
[im 47/93  soft-tissue]
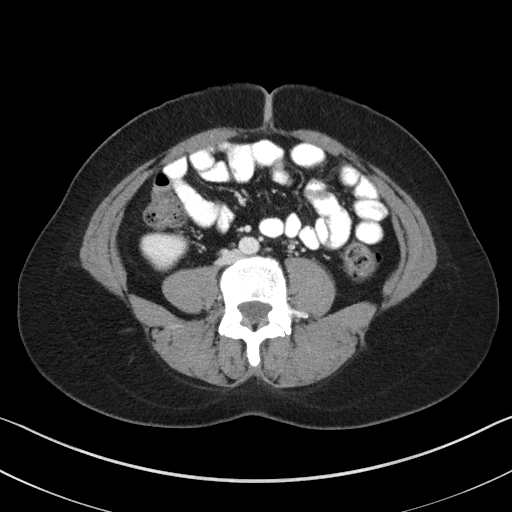
[im 54/93  soft-tissue]
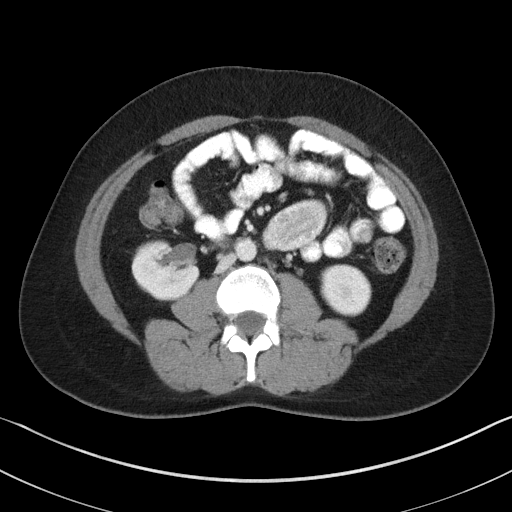
[im 62/93  soft-tissue]
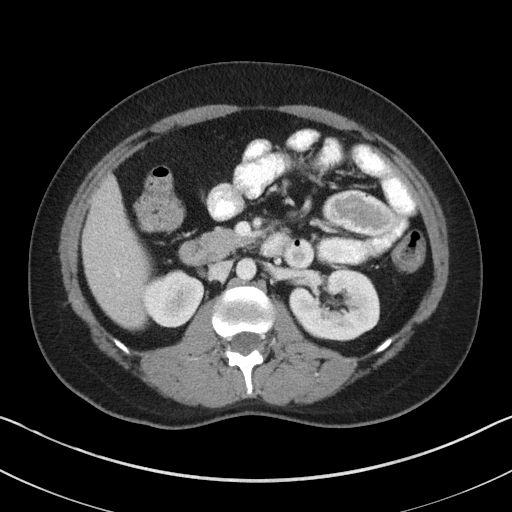
[im 62/93  bone]
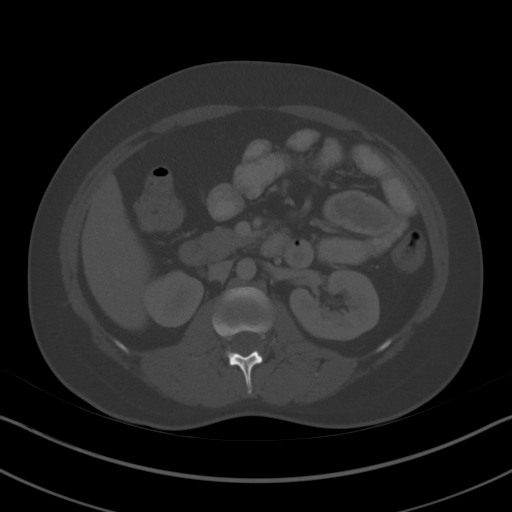
[im 66/93  soft-tissue]
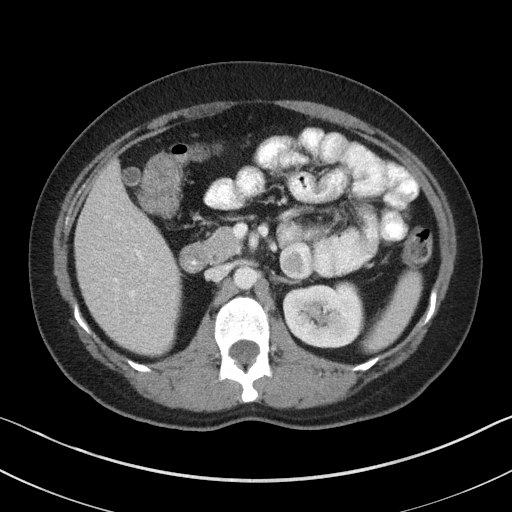
[im 73/93  soft-tissue]
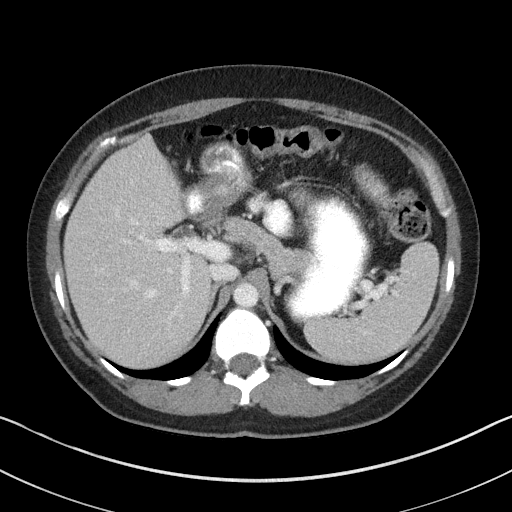
[im 77/93  lung]
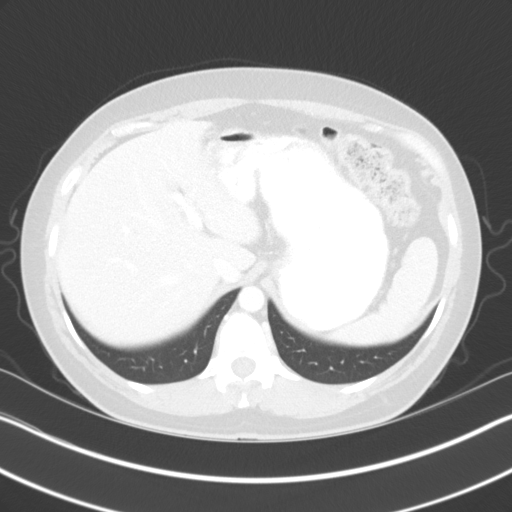
[im 81/93  soft-tissue]
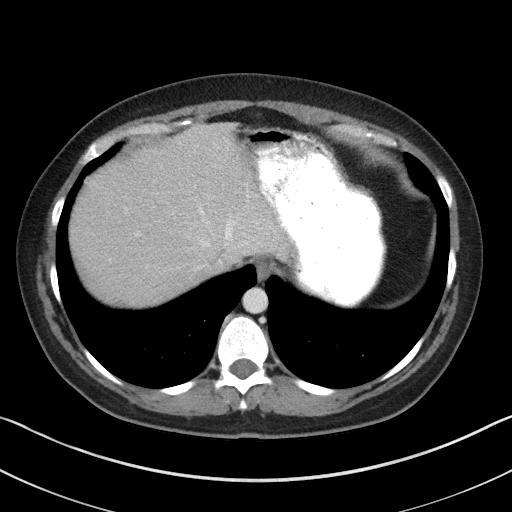
[im 81/93  lung]
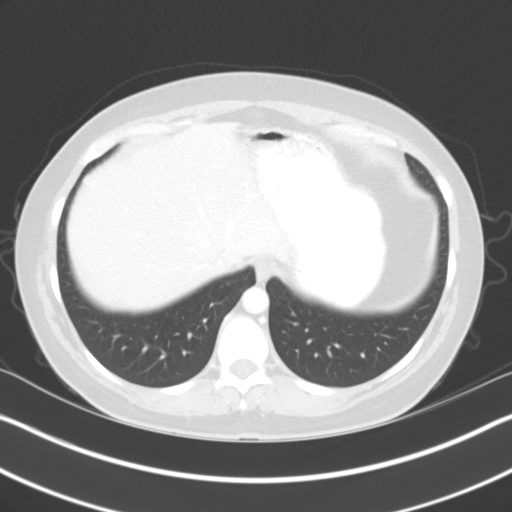
[im 85/93  lung]
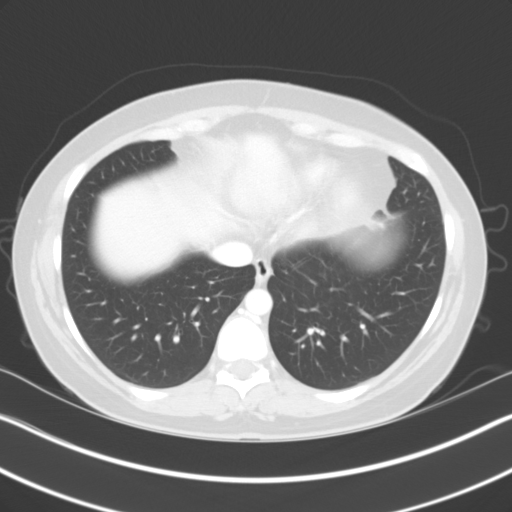
[im 89/93  soft-tissue]
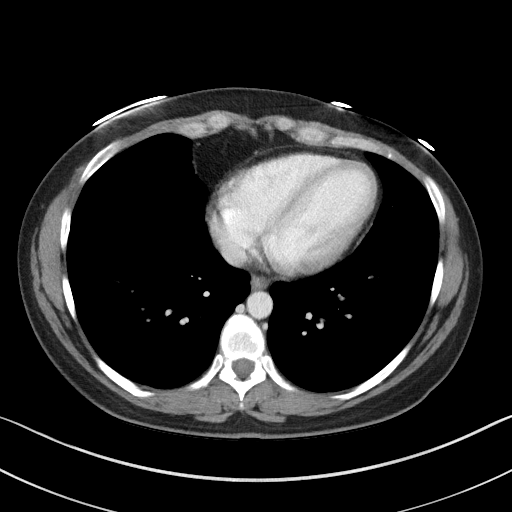
[im 89/93  lung]
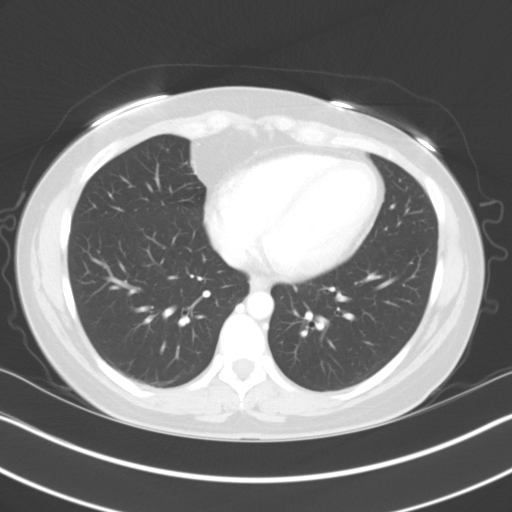

[15 of 32 positions shown; findings below may reference images not displayed]

FINDINGS: Lower chest:  No acute findings.

Hepatobiliary: No masses or other significant abnormality.
Gallbladder is unremarkable.

Pancreas: No mass, inflammatory changes, or other significant
abnormality.

Spleen: Within normal limits in size and appearance.

Adrenals/Urinary Tract: No masses identified. No evidence of
hydronephrosis.

Stomach/Bowel: No evidence of obstruction, inflammatory process, or
abnormal fluid collections. Although appendix is not well
visualized, there is no evidence of inflammatory process in the area
of the cecum or elsewhere.

Vascular/Lymphatic: No pathologically enlarged lymph nodes. No
evidence of abdominal aortic aneurysm.

Reproductive: Previous hysterectomy. No mass or other significant
abnormality. Small left ovarian corpus luteum cyst incidentally
noted. Tiny amount of free fluid in pelvic cul-de-sac is most likely
physiologic in a reproductive age female.

Other: None.

Musculoskeletal:  No suspicious bone lesions identified.
IMPRESSION: Small left ovarian corpus luteum cyst and tiny amount of free pelvic
fluid, which are considered physiologic in a reproductive age
female.

No other acute findings or significant abnormality identified.

## 2019-12-29 ENCOUNTER — Ambulatory Visit: Payer: Medicaid Other | Attending: Neurology

## 2019-12-29 DIAGNOSIS — R0683 Snoring: Secondary | ICD-10-CM | POA: Diagnosis not present

## 2019-12-29 DIAGNOSIS — G4719 Other hypersomnia: Secondary | ICD-10-CM | POA: Insufficient documentation

## 2019-12-30 ENCOUNTER — Other Ambulatory Visit: Payer: Self-pay

## 2020-07-12 ENCOUNTER — Encounter: Payer: Self-pay | Admitting: Sports Medicine

## 2020-07-12 ENCOUNTER — Ambulatory Visit: Payer: Medicaid Other | Admitting: Sports Medicine

## 2020-07-12 ENCOUNTER — Other Ambulatory Visit: Payer: Self-pay

## 2020-07-12 DIAGNOSIS — B070 Plantar wart: Secondary | ICD-10-CM

## 2020-07-12 DIAGNOSIS — M79674 Pain in right toe(s): Secondary | ICD-10-CM

## 2020-07-12 NOTE — Progress Notes (Signed)
Subjective: Karen Kramer is a 39 y.o. female patient who presents to office for evaluation of Right great toe pain secondary to moderately painful wart at the plantar aspect of the toe. Patient has tried over-the-counter wart medicine with no relief in symptoms. Patient denies any other pedal complaints.   Review of system noncontributory  Patient Active Problem List   Diagnosis Date Noted  . Seasonal allergic rhinitis due to pollen 06/27/2017  . Vitamin D deficiency 02/20/2016  . Overweight 02/20/2016  . Gastroesophageal reflux disease without esophagitis 01/25/2016  . Mixed incontinence urge and stress 08/24/2015  . Anxiety and depression 05/12/2015  . Insomnia 05/12/2015  . Infection of urinary tract 11/20/2012  . Edema 06/11/2012  . Complicated UTI (urinary tract infection) 03/31/2012  . Left knee pain 11/22/2011  . Syncope 05/28/2011  . LOW BACK PAIN, CHRONIC 05/17/2009  . SACROILIAC JOINT DYSFUNCTION 05/17/2009  . TROCHANTERIC BURSITIS, RIGHT 05/17/2009  . Other specified enthesopathies of unspecified lower limb, excluding foot 05/17/2009  . Sacrococcygeal disorders, not elsewhere classified 05/17/2009  . FATIGUE 03/23/2009  . MIGRAINE, COMMON 04/15/2008  . ASTHMA, PERSISTENT, MODERATE 04/15/2008  . Uncomplicated asthma 99/37/1696  . Anxiety 11/22/1991    Current Outpatient Medications on File Prior to Visit  Medication Sig Dispense Refill  . azelastine (ASTELIN) 0.1 % nasal spray Place 2 sprays into both nostrils daily.  12  . fluticasone (FLONASE) 50 MCG/ACT nasal spray 2 (TWO) SPRAY, NASAL, TWO TIMES DAILY  7  . levocetirizine (XYZAL) 5 MG tablet Take 5 mg by mouth at bedtime.    Marland Kitchen omeprazole (PRILOSEC) 20 MG capsule Take 1 capsule (20 mg total) by mouth 2 (two) times daily before a meal. 180 capsule 0  . oxymetazoline (AFRIN NASAL SPRAY) 0.05 % nasal spray Place 1 spray into both nostrils 2 (two) times daily. 30 mL 0  . topiramate (TOPAMAX) 25 MG tablet Take 75 mg by  mouth at bedtime.     No current facility-administered medications on file prior to visit.    Allergies  Allergen Reactions  . Latex Rash    Objective:  General: Alert and oriented x3 in no acute distress  Dermatology: Keratotic lesion present measuring less than 0.5 cm at plantar distal tuft of right hallux with no skin lines transversing the lesion, pain is present with medial lateral pressure to the lesion, capillaries with pin point bleeding noted, no webspace macerations, no ecchymosis bilateral, all nails x 10 are well manicured.  Vascular: Dorsalis Pedis and Posterior Tibial pedal pulses 2/4, Capillary Fill Time 3 seconds, + pedal hair growth bilateral, no edema bilateral lower extremities, Temperature gradient within normal limits.  Neurology: Johney Maine sensation intact via light touch bilateral.  Musculoskeletal: Mild tenderness with palpation at the lesion site on right hallux, Muscular strength 5/5 in all groups without pain or limitation on range of motion. No significant lower extremity muscular or boney deformity noted.  Assessment and Plan: Problem List Items Addressed This Visit    None    Visit Diagnoses    Plantar wart    -  Primary   Toe pain, right          -Complete examination performed -Discussed treatment options for wart at right great toe -Parred keratoic warty lesion x1 at right great toe using a chisel blade; treated the area with Catharidin covered with bandaid; Advised patient of blistering reaction that will occur from application of medication and once this happens replace bandaid with neosporin and tape/bandaid -Dispensed tube foam toe  cushion to use as needed for pain to the toe -Advised good hygiene habits -Patient to return to office in 3 weeks or sooner if condition worsens.  Landis Martins, DPM

## 2020-08-02 ENCOUNTER — Encounter: Payer: Self-pay | Admitting: Sports Medicine

## 2020-08-02 ENCOUNTER — Ambulatory Visit: Payer: Medicaid Other | Admitting: Sports Medicine

## 2020-08-02 ENCOUNTER — Other Ambulatory Visit: Payer: Self-pay

## 2020-08-02 DIAGNOSIS — M79674 Pain in right toe(s): Secondary | ICD-10-CM

## 2020-08-02 DIAGNOSIS — B070 Plantar wart: Secondary | ICD-10-CM | POA: Diagnosis not present

## 2020-08-02 NOTE — Progress Notes (Signed)
Subjective: Karen Kramer is a 39 y.o. female patient who returns to office for follow up evaluation of Right great toe pain secondary to moderately painful wart at the plantar aspect of the toe. Patient reports that she feels like she is doing better since last visit but does admit that she picked at the skin and it started to itch.  Patient denies any other pedal complaints.   Patient Active Problem List   Diagnosis Date Noted  . Seasonal allergic rhinitis due to pollen 06/27/2017  . Vitamin D deficiency 02/20/2016  . Overweight 02/20/2016  . Gastroesophageal reflux disease without esophagitis 01/25/2016  . Mixed incontinence urge and stress 08/24/2015  . Anxiety and depression 05/12/2015  . Insomnia 05/12/2015  . Infection of urinary tract 11/20/2012  . Edema 06/11/2012  . Complicated UTI (urinary tract infection) 03/31/2012  . Left knee pain 11/22/2011  . Syncope 05/28/2011  . LOW BACK PAIN, CHRONIC 05/17/2009  . SACROILIAC JOINT DYSFUNCTION 05/17/2009  . TROCHANTERIC BURSITIS, RIGHT 05/17/2009  . Other specified enthesopathies of unspecified lower limb, excluding foot 05/17/2009  . Sacrococcygeal disorders, not elsewhere classified 05/17/2009  . FATIGUE 03/23/2009  . MIGRAINE, COMMON 04/15/2008  . ASTHMA, PERSISTENT, MODERATE 04/15/2008  . Uncomplicated asthma 36/62/9476  . Anxiety 11/22/1991    Current Outpatient Medications on File Prior to Visit  Medication Sig Dispense Refill  . azelastine (ASTELIN) 0.1 % nasal spray Place 2 sprays into both nostrils daily.  12  . fluticasone (FLONASE) 50 MCG/ACT nasal spray 2 (TWO) SPRAY, NASAL, TWO TIMES DAILY  7  . levocetirizine (XYZAL) 5 MG tablet Take 5 mg by mouth at bedtime.    Marland Kitchen omeprazole (PRILOSEC) 20 MG capsule Take 1 capsule (20 mg total) by mouth 2 (two) times daily before a meal. 180 capsule 0  . oxymetazoline (AFRIN NASAL SPRAY) 0.05 % nasal spray Place 1 spray into both nostrils 2 (two) times daily. 30 mL 0  . topiramate  (TOPAMAX) 25 MG tablet Take 75 mg by mouth at bedtime.     No current facility-administered medications on file prior to visit.    Allergies  Allergen Reactions  . Latex Rash    Objective:  General: Alert and oriented x3 in no acute distress  Dermatology: Keratotic lesion present measuring less than 0.5 cm at plantar distal tuft of right hallux with no skin lines transversing the lesion, decreased pain is present with medial lateral pressure to the lesion, capillaries with pin point bleeding noted, no webspace macerations, no ecchymosis bilateral, all nails x 10 are well manicured.  Vascular: Dorsalis Pedis and Posterior Tibial pedal pulses 2/4, Capillary Fill Time 3 seconds, + pedal hair growth bilateral, no edema bilateral lower extremities, Temperature gradient within normal limits.  Neurology: Johney Maine sensation intact via light touch bilateral.  Musculoskeletal: Minimal tenderness with palpation at the lesion site on right hallux, Muscular strength 5/5 in all groups without pain or limitation on range of motion. No significant lower extremity muscular or boney deformity noted.  Assessment and Plan: Problem List Items Addressed This Visit    None    Visit Diagnoses    Plantar wart    -  Primary   Toe pain, right          -Complete examination performed -Discussed treatment options for wart at right great toe -Parred keratoic warty lesion x1 at right great toe using a chisel blade; treated the area with Catharidin covered with bandaid; this is treatment #2 to the area.  Advised patient of  blistering reaction that will occur from application of medication and once this happens replace bandaid with neosporin and tape/bandaid -Continue with good hygiene habits -Continue with toe cushion as needed  -Patient to return to office in 3-4 weeks or sooner if condition worsens.  Landis Martins, DPM

## 2020-09-01 ENCOUNTER — Ambulatory Visit: Payer: Medicaid Other | Admitting: Sports Medicine

## 2021-03-14 ENCOUNTER — Ambulatory Visit: Payer: Medicaid Other | Admitting: Dermatology

## 2021-03-15 ENCOUNTER — Other Ambulatory Visit: Payer: Self-pay

## 2021-03-15 ENCOUNTER — Ambulatory Visit: Payer: Medicaid Other | Admitting: Dermatology

## 2021-03-15 DIAGNOSIS — L659 Nonscarring hair loss, unspecified: Secondary | ICD-10-CM

## 2021-03-15 DIAGNOSIS — L649 Androgenic alopecia, unspecified: Secondary | ICD-10-CM

## 2021-03-15 DIAGNOSIS — L219 Seborrheic dermatitis, unspecified: Secondary | ICD-10-CM

## 2021-03-15 MED ORDER — FINASTERIDE 5 MG PO TABS: ORAL_TABLET | ORAL | 4 refills | Status: DC

## 2021-03-15 MED ORDER — KETOCONAZOLE 2 % EX SHAM: MEDICATED_SHAMPOO | CUTANEOUS | 6 refills | Status: DC

## 2021-03-15 NOTE — Patient Instructions (Addendum)
If you have any questions or concerns for your doctor, please call our main line at [phone] and press option 4 to reach your doctor's medical assistant. If no one answers, please leave a voicemail as directed and we will return your call as soon as possible. Messages left after 4 pm will be answered the following business day.   You may also send [place] a message via [facility]. We typically respond to MyChart messages within 1-2 business days.  For prescription refills, please ask your pharmacy to contact our office. Our fax number is [phone].  If you have an urgent issue when the clinic is closed that cannot wait until the next business day, you can page your doctor at the number below.    Please note that while we do our best to be available for urgent issues outside of office hours, we are not available 24/7.   If you have an urgent issue and are unable to reach [place], you may choose to seek medical care at your doctor's office, retail clinic, urgent care center, or emergency room.  If you have a medical emergency, please immediately call 911 or go to the emergency department.  Pager Numbers  - Dr. Nehemiah Massed: [phone]  - Dr. Laurence Ferrari: [phone]  - Dr. Nicole Kindred: [phone]  In the event of inclement weather, please call our main line at [phone] for an update on the status of any delays or closures.  Dermatology Medication Tips: Please keep the boxes that topical medications come in in order to help keep track of the instructions about where and how to use these. Pharmacies typically print the medication instructions only on the boxes and not directly on the medication tubes.   If your medication is too expensive, please contact our office at [phone] option 4 or send [place] a message through [place].   We are unable to tell what your co-pay for medications will be in advance as this is different depending on your insurance coverage. However, we may be able to find a substitute  medication at lower cost or fill out paperwork to get insurance to cover a needed medication.   If a prior authorization is required to get your medication covered by your insurance company, please allow [place] 1-2 business days to complete this process.  Drug prices often vary depending on where the prescription is filled and some pharmacies may offer cheaper prices.  The website www.goodrx.com contains coupons for medications through different pharmacies. The prices here do not account for what the cost may be with help from insurance (it may be cheaper with your insurance), but the website can give you the price if you did not use any insurance.  - You can print the associated coupon and take it with your prescription to the pharmacy.  - You may also stop by our office during regular business hours and pick up a GoodRx coupon card.  - If you need your prescription sent electronically to a different pharmacy, notify our office through [facility] or by phone at [phone] option 4.  Recommend minoxidil 5% (Rogaine for men) solution or foam to be applied to the scalp and left in. This should ideally be used twice daily for best results but it helps with hair regrowth when used at least three times per week. Rogaine initially can cause increased hair shedding for the first few weeks but this will stop with continued use. In studies, people who used minoxidil (Rogaine) for at least 6 months had thicker  hair than people who did not. Minoxidil topical (Rogaine) only works as long as it continues to be used. If if it is no longer used then the hair it has been helping to regrow can fall out. Minoxidil topical (Rogaine) can cause increased facial hair growth which can usually be managed easily with a battery-operated hair trimmer. If facial hair growth is bothersome, switching to the 2% women's version can decrease the risk of unwanted facial hair growth.  Recommend taking Heliocare sun protection  supplement daily in sunny weather for additional sun protection. For maximum protection on the sunniest days, you can take up to 2 capsules of regular Heliocare OR take 1 capsule of Heliocare Ultra. For prolonged exposure (such as a full day in the sun), you can repeat your dose of the supplement 4 hours after your first dose. Heliocare can be purchased at [facility] or at [email or web address].

## 2021-03-15 NOTE — Progress Notes (Signed)
New Patient Visit  Subjective  Karen Kramer is a 40 y.o. female who presents for the following: hair loss (Started last September - she had Covid in Nov 2020, mother had hair thinning in mid 40's, recent labs for TSH and ferritin were normal, patient states that women in her family had high testosterone levels.). Patient is currently using Biotin daily. She states her hair loss is causing her a lot of anxiety.   The following portions of the chart were reviewed this encounter and updated as appropriate:   Allergies  Meds  Problems  Med Hx  Surg Hx  Fam Hx      Review of Systems:  No other skin or systemic complaints except as noted in HPI or Assessment and Plan.  Objective  Well appearing patient in no apparent distress; mood and affect are within normal limits.  A focused examination was performed including the scalp. Relevant physical exam findings are noted in the Assessment and Plan.  Scalp Mild scale at scalp.  Scalp Diffuse thinning of the crown and widening of the midline part with retention of the frontal hairline - Reviewed progressive nature and prognosis.   Negative hair pull test.            Assessment & Plan  Seborrheic dermatitis Scalp  Seborrheic Dermatitis  -  is a chronic persistent rash characterized by pinkness and scaling most commonly of the mid face but also can occur on the scalp (dandruff), ears; mid chest and mid back. It tends to be exacerbated by stress and cooler weather.  People who have neurologic disease may experience new onset or exacerbation of existing seborrheic dermatitis.  The condition is not curable but treatable and can be controlled.  Reviewed TSH, ferritin, CMP - unremarkable  Start Ketoconazole 2% shampoo let sit 5-10 minutes before washing out. Use 2-3 days per week.   ketoconazole (NIZORAL) 2 % shampoo - Scalp Shampoo into the scalp let sit 5-10 minutes before washing out. Use 2-3 days per week.  Androgenetic  alopecia Scalp  Age related hair loss -   Plan checking vitamin D levels as low levels can be an issue with hair thinning/loss as well as make it difficult for regrowth.  Recommend minoxidil 5% (Rogaine for men) solution or foam to be applied to the scalp and left in. This should ideally be used twice daily for best results but it helps with hair regrowth when used at least three times per week. Rogaine initially can cause increased hair shedding for the first few weeks but this will stop with continued use. In studies, people who used minoxidil (Rogaine) for at least 6 months had thicker hair than people who did not. Minoxidil topical (Rogaine) only works as long as it continues to be used. If if it is no longer used then the hair it has been helping to regrow can fall out. Minoxidil topical (Rogaine) can cause increased facial hair growth which can usually be managed easily with a battery-operated hair trimmer. If facial hair growth is bothersome, switching to the 2% women's version can decrease the risk of unwanted facial hair growth.   Can continue Biotin supplement daily or switch to Nutrafol supplement (evidence of benefit) but discussed that Biotin (also present in Nutrafol) can make troponin levels appear normal in cases of myocardial infarction thus hiding a heart attack  Start Finasteride (patient has had hysteretomy) 2.5mg  po QD. Discussed possibility of decreased in libido and allergic reaction.  Discussed red light caps.  Vitamin D, 25-hydroxy - Scalp  finasteride (PROSCAR) 5 MG tablet - Scalp Take 1/2 tab po QD.  Return for alopecia follow up in 4-5 mths.  Luther Redo, CMA, am acting as scribe for Forest Gleason, MD .  Documentation: I have reviewed the above documentation for accuracy and completeness, and I agree with the above.  Forest Gleason, MD

## 2021-03-21 ENCOUNTER — Encounter: Payer: Self-pay | Admitting: Dermatology

## 2021-04-07 LAB — VITAMIN D 25 HYDROXY (VIT D DEFICIENCY, FRACTURES): Vit D, 25-Hydroxy: 17.9 ng/mL — ABNORMAL LOW (ref 30.0–100.0)

## 2021-04-09 ENCOUNTER — Telehealth: Payer: Self-pay

## 2021-04-09 MED ORDER — VITAMIN D (ERGOCALCIFEROL) 1.25 MG (50000 UNIT) PO CAPS: 50000.0000 [IU] | ORAL_CAPSULE | ORAL | 0 refills | Status: DC

## 2021-04-09 NOTE — Telephone Encounter (Signed)
-----   Message from [place], MD sent at 04/08/2021  9:45 PM EDT ----- Vitamin D deficiency 17.9 ng/mL Recommend starting prescription vitamin D 50,000 IU once per week for 6 weeks, then taking 1000 IU daily with plan to recheck vitamin D levels in 3-6 months.   MAs please call and prescribe 50,000 IU vitamin D3 once per week for 6 weeks. Thank you!

## 2021-04-09 NOTE — Telephone Encounter (Signed)
Patient advised Vitamin D low, sent in Vitamin D 50,000 IS to take once weekly for 6 weeks. Patient will then start Vitamin D 1000 daily and recheck Vitamin D levels in 3-6 months. Reminder set to call patient and send in lab order in 3 months./js

## 2021-04-12 ENCOUNTER — Inpatient Hospital Stay: Payer: Medicaid Other

## 2021-04-12 ENCOUNTER — Other Ambulatory Visit: Payer: Self-pay

## 2021-04-12 ENCOUNTER — Inpatient Hospital Stay: Payer: Medicaid Other | Attending: Genetic Counselor | Admitting: Genetic Counselor

## 2021-04-12 DIAGNOSIS — Z8481 Family history of carrier of genetic disease: Secondary | ICD-10-CM

## 2021-04-12 DIAGNOSIS — Z806 Family history of leukemia: Secondary | ICD-10-CM

## 2021-04-12 DIAGNOSIS — Z803 Family history of malignant neoplasm of breast: Secondary | ICD-10-CM

## 2021-04-12 DIAGNOSIS — Z801 Family history of malignant neoplasm of trachea, bronchus and lung: Secondary | ICD-10-CM

## 2021-04-12 DIAGNOSIS — Z800 Family history of malignant neoplasm of digestive organs: Secondary | ICD-10-CM | POA: Diagnosis not present

## 2021-04-12 DIAGNOSIS — Z8042 Family history of malignant neoplasm of prostate: Secondary | ICD-10-CM

## 2021-04-12 LAB — GENETIC SCREENING ORDER

## 2021-04-16 ENCOUNTER — Encounter: Payer: Self-pay | Admitting: Genetic Counselor

## 2021-04-16 DIAGNOSIS — Z800 Family history of malignant neoplasm of digestive organs: Secondary | ICD-10-CM | POA: Insufficient documentation

## 2021-04-16 DIAGNOSIS — Z801 Family history of malignant neoplasm of trachea, bronchus and lung: Secondary | ICD-10-CM | POA: Insufficient documentation

## 2021-04-16 DIAGNOSIS — Z806 Family history of leukemia: Secondary | ICD-10-CM | POA: Insufficient documentation

## 2021-04-16 DIAGNOSIS — Z8042 Family history of malignant neoplasm of prostate: Secondary | ICD-10-CM | POA: Insufficient documentation

## 2021-04-16 DIAGNOSIS — Z8481 Family history of carrier of genetic disease: Secondary | ICD-10-CM | POA: Insufficient documentation

## 2021-04-16 DIAGNOSIS — Z803 Family history of malignant neoplasm of breast: Secondary | ICD-10-CM | POA: Insufficient documentation

## 2021-04-16 NOTE — Progress Notes (Signed)
REFERRING PROVIDER: Luciana Axe, NP No address on file  PRIMARY PROVIDER:  Luciana Axe, NP  PRIMARY REASON FOR VISIT:  1. Family history of gene mutation   2. Family history of pancreatic cancer   3. Family history of leukemia   4. Family history of lung cancer   5. Family history of breast cancer   6. Family history of prostate cancer      HISTORY OF PRESENT ILLNESS:   Karen Kramer, a 40 y.o. female, was seen for a [facility] cancer genetics consultation at the request of Dr. Vincenza Hews due to a family history of cancer and a known gene mutation.  Karen Kramer presents to clinic today to discuss the possibility of a hereditary predisposition to cancer, genetic testing, and to further clarify her future cancer risks, as well as potential cancer risks for family members.   Karen Kramer does not have a personal history of cancer.    RISK FACTORS:  Menarche was at age 27.  First live birth at age 89.  OCP use for approximately 0 years.  Ovaries intact: yes.  Hysterectomy: yes.  Menopausal status: premenopausal.  HRT use: 0 years. Colonoscopy: no; not examined. Mammogram within the last year: no. Number of breast biopsies: 0. Any excessive radiation exposure in the past: no.  Past Medical History:  Diagnosis Date   Asthma    Atypical cervical glandular cells    Depression    Family history of breast cancer    Family history of gene mutation    Family history of leukemia    Family history of lung cancer    Family history of pancreatic cancer    Family history of prostate cancer     Past Surgical History:  Procedure Laterality Date   ABDOMINAL HYSTERECTOMY     Partical   TUBAL LIGATION  2008    Social History   Socioeconomic History   Marital status: Divorced    Spouse name: Not on file   Number of children: 2   Years of education: Not on file   Highest education level: Not on file  Occupational History   Occupation: homemaker  Tobacco Use   Smoking  status: Never   Smokeless tobacco: Not on file  Substance and Sexual Activity   Alcohol use: Yes    Comment: "Once every few months"   Drug use: No   Sexual activity: Not on file  Other Topics Concern   Not on file  Social History Narrative   Regular exercise-yes 2-3 times a week, walk/treadmill   Social Determinants of Health   Financial Resource Strain: Not on file  Food Insecurity: Not on file  Transportation Needs: Not on file  Physical Activity: Not on file  Stress: Not on file  Social Connections: Not on file     FAMILY HISTORY:  We obtained a detailed, 4-generation family history.  Significant diagnoses are listed below: Family History  Problem Relation Age of Onset   Pancreatic cancer Mother 49   Lung disease Father    Seizures Father 56   Lung cancer Father 35       mets to brain, hx smoking   Other Half-Sister        [facility] gene mutation carrier   Leukemia Maternal Grandmother 74       acute   Prostate cancer Maternal Grandfather 68       metastatic   Lung cancer Maternal Grandfather 79   Cancer Paternal Grandfather  prostate   Breast cancer Other 10       MGM's sister   Prostate cancer Maternal Great-grandfather 24       MGM's father   Karen Kramer has one daughter (age 77) and one son (age 44). She has one full-sister (age 72), one maternal half-sister (age 14), and one paternal half-brother (age 21). None of these relatives have had cancer. Her maternal half-sister had genetic testing (carrier screening) that revealed a mutation in the [facility] gene (fanconi anemia group C). This record was not available for review.  Karen Kramer mother died at age 24 with pancreatic cancer (diagnosed age 42). There was one maternal uncle, who has not had cancer. Karen Kramer maternal grandmother died at age 27 from acute leukemia and she reportedly had Fanconi anemia. Her maternal grandfather died at age 55 from metastatic prostate cancer and lung cancer (diagnosed at age  71). There is a great-aunt (MGM's sister) who had breast cancer in her mid-50s. Her great-grandfather (MGM's father) had prostate cancer in his mid-10s.  Karen Kramer father died at age 22 with lung caner (diagnosed age 85) and had a history of smoking. There were two paternal auns. There is no known cancer among paternal aunts or paternal cousins. Karen Kramer paternal grandmother is alive at age 73 without cancer. Her paternal grandfather is alive at age 65 without cancer.  Karen Kramer is aware of previous family history of genetic testing for hereditary cancer risks. Patient's maternal ancestors are of [place], [place], and [place] descent, and paternal ancestors are of [place] and [place] descent. There is no reported Ashkenazi Jewish ancestry. There is no known consanguinity.  GENETIC COUNSELING ASSESSMENT: Karen Kramer is a 40 y.o. female with a family history of a known Fanconi anemia gene mutation and cancer which is somewhat suggestive of a hereditary cancer syndrome and predisposition to cancer. We, therefore, discussed and recommended the following at today's visit.   DISCUSSION:  Ms. Kopec maternal half-sister was shown to be a carrier of a mutation in the [facility] gene (Fanconi anemia complementation group c). She states that most women in the family have been shown to be carriers of this mutation, although genetic test reports were unable for review. Based on available information, Karen Kramer has a 25% (1 in 4) chance to also have the [facility] variant that was discovered in her half-sister. We reviewed that pathogenic variants in the [facility] gene are associated with autosomal recessive fanconi anemia - a rare genetic condition that is classically characterized by limb abnormalities, short stature, bone marrow failure, and increased risk for malignancy. Carriers of just one [place] variant are not known to have an increased risk for cancer, although limited evidence has suggested there may be an  association with pancreatic cancer and/or breast cancer (PMID [id number], [id number], [id number], [id number]). We requested that Karen Kramer obtain a copy of her sister's genetic test report prior to ordering the genetic test. She will attempt to get this information.   We also reviewed that there are other hereditary cancer genes that may be associated with the cancers seen in the family. Approximately 5-10% of pancreatic cancer is hereditary, with most cases associated with the BRCA1 and BRCA2 genes. There are other genes that may be associated with hereditary pancreatic, prostate, and/or breast cancers, including ATM, CHEK2, PALB2, CDKN2A, etc. We discussed that testing is beneficial for several reasons including knowing about other cancer risks, identifying potential screening and risk-reduction options that may be appropriate, and to understand if other family  members could be at risk for cancer and allow them to undergo genetic testing.  We reviewed the characteristics, features and inheritance patterns of hereditary cancer syndromes. We also discussed genetic testing, including the appropriate family members to test, the process of testing, insurance coverage, genetic discrimination, and turn-around-time for results. We discussed the implications of a negative, positive and/or variant of uncertain significant result. We recommended Karen Kramer pursue genetic testing for the [facility] Multi-Cancer panel and Pancreatic Cancer panel.   The Multi-Cancer Panel offered by [facility] includes sequencing and/or deletion duplication testing of the following 84 genes: AIP, ALK, APC, ATM, AXIN2,BAP1,  BARD1, BLM, BMPR1A, BRCA1, BRCA2, BRIP1, CASR, CDC73, CDH1, CDK4, CDKN1B, CDKN1C, CDKN2A (p14ARF), CDKN2A (p16INK4a), CEBPA, CHEK2, CTNNA1, DICER1, DIS3L2, EGFR (c.2369C>T, p.Thr790Met variant only), EPCAM (Deletion/duplication testing only), FH, FLCN, GATA2, GPC3, GREM1 (Promoter region deletion/duplication testing only), HOXB13  (c.251G>A, p.Gly84Glu), HRAS, KIT, MAX, MEN1, MET, MITF (c.952G>A, p.Glu318Lys variant only), MLH1, MSH2, MSH3, MSH6, MUTYH, NBN, NF1, NF2, NTHL1, PALB2, PDGFRA, PHOX2B, PMS2, POLD1, POLE, POT1, PRKAR1A, PTCH1, PTEN, RAD50, RAD51C, RAD51D, RB1, RECQL4, RET, RUNX1, SDHAF2, SDHA (sequence changes only), SDHB, SDHC, SDHD, SMAD4, SMARCA4, SMARCB1, SMARCE1, STK11, SUFU, TERC, TERT, TMEM127, TP53, TSC1, TSC2, VHL, WRN and WT1.  The Pancreatic Cancer Panel offered by [facility] includes sequencing and deletion/duplication analysis of the following 29 genes: APC, ATM, BMPR1A, BRCA1, BRCA2, CASR, CDK4, CDKN2A, CFTR, CPA1, CTRC, EPCAM, FANCC, MEN1, MLH1, MSH2, MSH6, NF1, PALB2, PALLD, PMS2, PRSS1, SMAD4, SPINK1, STK11, TP53, TSC1, TSC2, and VHL.   Based on Karen Kramer's family history of cancer, she meets medical criteria for genetic testing. Despite that she meets criteria, there may still be an out of pocket cost. We discussed that if her out of pocket cost for testing is over $100, the laboratory will reach out to let her know. If the out of pocket cost of testing is less than $100 she will be billed by the genetic testing laboratory.   PLAN: After considering the risks, benefits, and limitations, Karen Kramer provided informed consent to pursue genetic testing. Before we place the order for testing, she will attempt to obtain a copy of her sister's genetic test report.   The blood sample will be sent to [facility] for analysis of the Multi-Cancer Panel + Pancreatic Cancer Panel. Once the order is placed, results should be available within approximately two-three weeks' time, at which point they will be disclosed by telephone to Karen Kramer, as will any additional recommendations warranted by these results. Karen Kramer will receive a summary of her genetic counseling visit and a copy of her results once available. This information will also be available in Epic.   Karen Kramer questions were answered to her  satisfaction today. Our contact information was provided should additional questions or concerns arise. Thank you for the referral and allowing [place] to share in the care of your patient.   Clint Guy, [place], [facility] Licensed, Certified Dispensing [email or web address] Phone: [phone]  The patient was seen for a total of 40 minutes in face-to-face genetic counseling. Patient was seen with her daughter. This patient was discussed with Drs. Magrinat, Lindi Adie and/or Burr Medico who agrees with the above.    _______________________________________________________________________ For Office Staff:  Number of people involved in session: 1 Was an Intern/ student involved with case: no

## 2021-04-19 ENCOUNTER — Telehealth: Payer: Self-pay | Admitting: Genetic Counselor

## 2021-04-19 NOTE — Telephone Encounter (Signed)
LVM to follow-up on the email Ms. Husain sent that her sister was not able to provide a copy of her genetic test report. Requested that she call back to discuss and that we can move forward with testing at this time.

## 2021-05-01 DIAGNOSIS — Z1589 Genetic susceptibility to other disease: Secondary | ICD-10-CM | POA: Insufficient documentation

## 2021-05-01 DIAGNOSIS — Z1379 Encounter for other screening for genetic and chromosomal anomalies: Secondary | ICD-10-CM | POA: Insufficient documentation

## 2021-05-02 ENCOUNTER — Telehealth: Payer: Self-pay | Admitting: Genetic Counselor

## 2021-05-02 NOTE — Telephone Encounter (Signed)
LVM that her genetic test results are available and requested that she call back to discuss them.  

## 2021-05-04 ENCOUNTER — Ambulatory Visit: Payer: Self-pay | Admitting: Genetic Counselor

## 2021-05-04 ENCOUNTER — Encounter: Payer: Self-pay | Admitting: Genetic Counselor

## 2021-05-04 DIAGNOSIS — Z1379 Encounter for other screening for genetic and chromosomal anomalies: Secondary | ICD-10-CM

## 2021-05-04 DIAGNOSIS — Z1589 Genetic susceptibility to other disease: Secondary | ICD-10-CM

## 2021-05-04 NOTE — Telephone Encounter (Signed)
LVM that her genetic test results are available and requested that she call back to discuss them.  

## 2021-05-04 NOTE — Telephone Encounter (Addendum)
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  This result does not explain the family history of cancers. Discussed that we do not know why there is cancer in the family. There could be a genetic mutation in the family that Ms. Stiltner did not inherit. There could also be a mutation in a different gene that we are not testing, or our current technology may not be able to detect certain mutations. It will therefore be important for her to stay in contact with genetics to keep up with whether additional testing may be appropriate in the future.

## 2021-05-04 NOTE — Progress Notes (Signed)
GENETIC TEST RESULTS   Patient Name: Karen Kramer Patient Age: 40 y.o. Encounter Date: 05/04/2021  Referring Provider: Luciana Axe, NP   HPI: [name] was previously seen in the [facility] clinic due to a family history of cancer and concerns regarding a hereditary predisposition to cancer. Please refer to our prior cancer genetics clinic note for more information regarding [name]'s medical, social and family histories, and our assessment and recommendations, at the time. [name] recent genetic test results were disclosed to her, as were recommendations warranted by these results. These results and recommendations are discussed in more detail below.   FAMILY HISTORY:  We obtained a detailed, 4-generation family history.  Significant diagnoses are listed below: Family History  Problem Relation Age of Onset   Pancreatic cancer Mother 65   Lung disease Father    Seizures Father 72   Lung cancer Father 80       mets to brain, hx smoking   Other Half-Sister        [facility] gene mutation carrier   Leukemia Maternal Grandmother 74       acute   Prostate cancer Maternal Grandfather 68       metastatic   Lung cancer Maternal Grandfather 70   Cancer Paternal Grandfather        prostate   Breast cancer Other 62       MGM's sister   Prostate cancer Maternal Great-grandfather 20       MGM's father   [name] has one daughter (age 71) and one son (age 56). She has one full-sister (age 54), one maternal half-sister (age 29), and one paternal half-brother (age 4). None of these relatives have had cancer. Her maternal half-sister had genetic testing (carrier screening) that revealed a mutation in the [facility] gene (fanconi anemia group C). This record was not available for review.   [name] mother died at age 46 with pancreatic cancer (diagnosed age 69). There was one maternal uncle, who has not had cancer. [name] maternal grandmother died at age 48  from acute leukemia and she reportedly had Fanconi anemia. Her maternal grandfather died at age 40 from metastatic prostate cancer and lung cancer (diagnosed at age 22). There is a great-aunt (MGM's sister) who had breast cancer in her mid-67s. Her great-grandfather (MGM's father) had prostate cancer in his mid-39s.   [name] father died at age 53 with lung caner (diagnosed age 41) and had a history of smoking. There were two paternal auns. There is no known cancer among paternal aunts or paternal cousins. [name] paternal grandmother is alive at age 5 without cancer. Her paternal grandfather is alive at age 39 without cancer.   [name] is aware of previous family history of genetic testing for hereditary cancer risks. Patient's maternal ancestors are of [place], [place], and [place] descent, and paternal ancestors are of [place] and [place] descent. There is no reported Ashkenazi Jewish ancestry. There is no known consanguinity.  GENETIC TEST RESULTS:  Genetic testing reported out on 05/01/2021 through the Multi-Cancer panel and Pancreatic Cancer panel offered by [facility]. A single, heterozygous pathogenic variant was detected in the WRN gene called c.2959C>T (R.[id number]*).   Pathogenic variants in the WRN gene are associated with autosomal recessvie Werner syndrome. Since [name] has only one pathogenic mutation in [place], she is NOT affected with Werner syndrome, but instead is a carrier.   The Multi-Cancer Panel offered by [facility] includes sequencing and/or deletion  duplication testing of the following 84 genes: AIP, ALK, APC, ATM, AXIN2,BAP1,  BARD1, BLM, BMPR1A, BRCA1, BRCA2, BRIP1, CASR, CDC73, CDH1, CDK4, CDKN1B, CDKN1C, CDKN2A (p14ARF), CDKN2A (p16INK4a), CEBPA, CHEK2, CTNNA1, DICER1, DIS3L2, EGFR (c.2369C>T, p.Thr790Met variant only), EPCAM (Deletion/duplication testing only), FH, FLCN, GATA2, GPC3, GREM1 (Promoter region deletion/duplication testing only), HOXB13 (c.251G>A,  p.Gly84Glu), HRAS, KIT, MAX, MEN1, MET, MITF (c.952G>A, p.Glu318Lys variant only), MLH1, MSH2, MSH3, MSH6, MUTYH, NBN, NF1, NF2, NTHL1, PALB2, PDGFRA, PHOX2B, PMS2, POLD1, POLE, POT1, PRKAR1A, PTCH1, PTEN, RAD50, RAD51C, RAD51D, RB1, RECQL4, RET, RUNX1, SDHAF2, SDHA (sequence changes only), SDHB, SDHC, SDHD, SMAD4, SMARCA4, SMARCB1, SMARCE1, STK11, SUFU, TERC, TERT, TMEM127, TP53, TSC1, TSC2, VHL, WRN and WT1. A copy of the test report will be scanned into Epic and located under the Molecular Pathology section of the Results Review tab.  A portion of the result report is included below for reference.     [name] genetic test results do not explain why she has a family history of cancer. We discussed with [name] that because current genetic testing is not perfect, it is possible there may be a gene mutation in one of these genes that current testing cannot detect, but that chance is small.  We also discussed that there could be another gene that has not yet been discovered, or that we have not yet tested, that is responsible for the cancer diagnoses in the family. It is also possible there is a hereditary cause for the cancer in the family that [name] did not inherit and therefore was not identified in her testing. Therefore, it is important to remain in touch with cancer genetics in the future so that we can continue to offer [name] the most up to date genetic testing.  DISCUSSION OF RESULTS & RECOMMENDATIONS:  [name] syndrome is an autosomal recessive condition that is characterized by premature aging. Clinical features typically consist of bilateral ocular cataracts, premature graying and/or thinning of scalp hair, skin changes, and short stature. Individuals with [name] syndrome also have in increased risk for certain cancers, including sarcomas and other rare cancer types in typical locations. Due to its autosomal recessive inheritance, two pathogenic variants in the WRN gene (one on each  chromosome) are needed for someone to have Werner syndrome. Ms. Pieratt has only one pathogenic variant in the [place] gene. This means that she is not affected with Werner syndrome, but instead is an asymptomatic carrier. Having just one pathogenic variant in [place] is not known to cause health problems.   We also discussed that this testing did NOT detect a mutation in the [facility] gene. This suggests that Ms. Gemmill did not inherit the [facility] mutation that was reported in her maternal half-sister.   Additionally, this result does not explain Ms. Slee's family history of cancer. Most cancers happen by chance and her test result suggests that the family history of cancer may fall into this category. While reassuring, this does not definitively rule out a hereditary predisposition to cancer. It is still possible that there could be genetic mutations that are undetectable by current technology. There could be genetic mutations in genes that have not been tested or identified to increase cancer risk. Therefore, it is recommended she continue to follow the cancer management and screening guidelines provided by her primary healthcare provider.  An individual's cancer risk and medical management are not determined by genetic test results alone. Overall cancer risk assessment incorporates additional factors, including personal medical history, family history, and any available genetic  information that may result in a personalized plan for cancer prevention and surveillance.  Breast Cancer Risk: Based on Ms. Mitcham's personal and family history, as well as her genetic test results, the Tyrer-Cuzick breast cancer risk model was used to estimate her risk of developing breast cancer. Tyrer-Cuzick estimates her lifetime risk of developing breast cancer to be approximately 9.2%. This lifetime breast cancer risk is a preliminary estimate based on available information using one of several models endorsed by the [place] (ACS). The ACS recommends consideration of breast MRI screening as an adjunct to mammography for patients at high risk (defined as 20% or greater lifetime risk). A more detailed breast cancer risk assessment can be considered, if clinically indicated. This risk estimate can change over time and may be repeated to reflect new information in her personal or family history in the future.      FAMILY MEMBERS: Since we now know the WRN variant in Ms. Troutman, we can test at-risk relatives to determine whether or not they have inherited the pathogenic variant and have an increased chance to have a child with Werner syndrome. We will be happy to meet with any of the family members or refer them to a genetic counselor in their local area. To locate genetic counselors in other cities, individuals can visit the website "[email or web address]" and search for a cancer genetic counselor by zip code.   Regarding the family history of cacner, individuals in this family might be at some increased risk of developing cancer, over the general population risk, simply due to the family history of cancer.  We recommended women in this family have a yearly mammogram beginning at age 37, or 72 years younger than the earliest onset of cancer, an annual clinical breast exam, and perform monthly breast self-exams. Women in this family should also have a gynecological exam as recommended by their primary provider. All family members should be referred for colonoscopy starting at age 63.  It is also possible there is a hereditary cause for the cancer in Ms. Chavis's family that she did not inherit and therefore was not identified in her.  Based on Ms. Yakel's family history, we recommended her maternal relatives have genetic counseling and testing for hereditary cancer risks. Ms. Yano will let us know if we can be of any assistance in coordinating genetic counseling and/or testing for these family members.     FOLLOW-UP: Lastly, we discussed with Ms. Wos that cancer genetics is a rapidly advancing field and it is possible that new genetic tests will be appropriate for her and/or her family members in the future. We encouraged her to remain in contact with cancer genetics on an annual basis so we can update her personal and family histories and let her know of advances in cancer genetics that may benefit this family.   Our contact number was provided. Ms. Bessent questions were answered to her satisfaction, and she knows she is welcome to call us at anytime with additional questions or concerns.    Clint Guy, [place], [facility] Licensed, Certified Dispensing [email or web address] Phone: [phone]

## 2021-07-10 ENCOUNTER — Other Ambulatory Visit: Payer: Self-pay

## 2021-07-10 ENCOUNTER — Telehealth: Payer: Self-pay

## 2021-07-10 DIAGNOSIS — L649 Androgenic alopecia, unspecified: Secondary | ICD-10-CM

## 2021-07-10 NOTE — Telephone Encounter (Signed)
Left msg for patient to call office about having Vitamin D lab rechecked.

## 2021-07-10 NOTE — Telephone Encounter (Signed)
Patient will come by office to pick up order for Vitamin D lab.

## 2021-07-25 ENCOUNTER — Telehealth: Payer: Self-pay

## 2021-07-25 LAB — VITAMIN D 25 HYDROXY (VIT D DEFICIENCY, FRACTURES): Vit D, 25-Hydroxy: 34 ng/mL (ref 30.0–100.0)

## 2021-07-25 NOTE — Telephone Encounter (Signed)
Left message for patient to call office for results/hd 

## 2021-08-08 ENCOUNTER — Telehealth: Payer: Self-pay

## 2021-08-08 NOTE — Telephone Encounter (Signed)
-----   Message from Alfonso Patten, MD sent at 07/25/2021 12:04 PM EDT ----- Vitamin D now within normal limits but still low for hair regrowth. Recommend increasing to vitamin D 2000 IU for the next 3 months and then dropping back to 1000 IU daily for long-term maintenance.   MAs please call. Thank you!

## 2021-08-08 NOTE — Telephone Encounter (Signed)
Patient advised Vitamin D WNL but low for hair regrowth. Recommend increasing to 2000 IU Vitamin D for 3 months then decrease to 1000 IU for maintenance./js

## 2021-08-09 ENCOUNTER — Ambulatory Visit: Payer: Medicaid Other | Admitting: Dermatology

## 2021-08-09 ENCOUNTER — Other Ambulatory Visit: Payer: Self-pay

## 2021-08-09 DIAGNOSIS — L649 Androgenic alopecia, unspecified: Secondary | ICD-10-CM | POA: Diagnosis not present

## 2021-08-09 DIAGNOSIS — L219 Seborrheic dermatitis, unspecified: Secondary | ICD-10-CM

## 2021-08-09 DIAGNOSIS — L820 Inflamed seborrheic keratosis: Secondary | ICD-10-CM | POA: Diagnosis not present

## 2021-08-09 DIAGNOSIS — L821 Other seborrheic keratosis: Secondary | ICD-10-CM | POA: Diagnosis not present

## 2021-08-09 NOTE — Patient Instructions (Signed)

## 2021-08-09 NOTE — Progress Notes (Signed)
Follow-Up Visit   Subjective  Karen Kramer is a 40 y.o. female who presents for the following: androgenic alopecia  (Patient is not interested in taking pills or using shampoos at this time because she was told that if she stopped treatment her hair would start falling out again. ). She has noticed a lesion on her L ear that is crusted and her husband picks at and a spot on her L shoulder that she would like checked today.    The following portions of the chart were reviewed this encounter and updated as appropriate:   Tobacco  Allergies  Meds  Problems  Med Hx  Surg Hx  Fam Hx      Review of Systems:  No other skin or systemic complaints except as noted in HPI or Assessment and Plan.  Objective  Well appearing patient in no apparent distress; mood and affect are within normal limits.  A focused examination was performed including the scalp. Relevant physical exam findings are noted in the Assessment and Plan.  Scalp Negative hair pull test.   Scalp Erythematous, scaly patches involving the ankle and distal lower leg with associated lower leg edema.   L post auricular, L shoulder Stuck-on, waxy, tan-brown papules and plaques -- Discussed benign etiology and prognosis.   L post auricular x 1 Erythematous keratotic or waxy stuck-on papule or plaque.    Assessment & Plan  Androgenic alopecia Scalp  Discussed with patient hair loss is age related and is not curable but treatable.   Recommend minoxidil 5% (Rogaine for men) solution or foam to be applied to the scalp and left in. This should ideally be used twice daily for best results but it helps with hair regrowth when used at least three times per week. Rogaine initially can cause increased hair shedding for the first few weeks but this will stop with continued use. In studies, people who used minoxidil (Rogaine) for at least 6 months had thicker hair than people who did not. Minoxidil topical (Rogaine) only works as  long as it continues to be used. If if it is no longer used then the hair it has been helping to regrow can fall out. Minoxidil topical (Rogaine) can cause increased facial hair growth which can usually be managed easily with a battery-operated hair trimmer. If facial hair growth is bothersome, switching to the 2% women's version can decrease the risk of unwanted facial hair growth.    Can continue Biotin supplement daily or switch to Nutrafol supplement (evidence of benefit) but discussed that Biotin (also present in Nutrafol) can make troponin levels appear normal in cases of myocardial infarction thus hiding a heart attack.  Discussed red light cap daily.   Discussed PRP injections (not done here in our office).   Recommend Ketoconazole 2% shampoo 2-3 days per week. Let sit 5-10 mintues before washing out.   Continue Vitamin D 2000 IU QD for the next 3 months to help boost Vitamin D to optimal hair growth range  Seborrheic dermatitis Scalp  Chronic condition with duration or expected duration over one year. Condition is bothersome to patient. Currently flared.  Seborrheic Dermatitis  -  is a chronic persistent rash characterized by pinkness and scaling most commonly of the mid face but also can occur on the scalp (dandruff), ears; mid chest, mid back and groin.  It tends to be exacerbated by stress and cooler weather.  People who have neurologic disease may experience new onset or exacerbation of existing seborrheic  dermatitis.  The condition is not curable but treatable and can be controlled.   Start Ketoconazole 2% shampoo 2-3 days per week. Let sit 5-10 minutes before washing out.   Related Medications ketoconazole (NIZORAL) 2 % shampoo Shampoo into the scalp let sit 5-10 minutes before washing out. Use 2-3 days per week.  Seborrheic keratosis L post auricular, L shoulder  Reassured benign age-related growth.  Recommend observation.  Discussed cryotherapy if spot(s) become  irritated or inflamed.  Inflamed seborrheic keratosis L post auricular x 1  Getting traumatized and crusting up   Prior to procedure, discussed risks of blister formation, small wound, skin dyspigmentation, or rare scar following cryotherapy. Recommend Vaseline ointment to treated areas while healing.   Destruction of lesion - L post auricular x 1 Complexity: simple   Destruction method: cryotherapy   Informed consent: discussed and consent obtained   Timeout:  patient name, date of birth, surgical site, and procedure verified Lesion destroyed using liquid nitrogen: Yes   Region frozen until ice ball extended beyond lesion: Yes   Outcome: patient tolerated procedure well with no complications   Post-procedure details: wound care instructions given    Return if symptoms worsen or fail to improve.  Luther Redo, CMA, am acting as scribe for Forest Gleason, MD .  Documentation: I have reviewed the above documentation for accuracy and completeness, and I agree with the above.  Forest Gleason, MD

## 2021-08-21 ENCOUNTER — Encounter: Payer: Self-pay | Admitting: Dermatology

## 2021-10-03 ENCOUNTER — Ambulatory Visit: Payer: Medicaid Other | Admitting: Dermatology

## 2021-11-14 ENCOUNTER — Ambulatory Visit: Payer: Medicaid Other | Admitting: Dermatology

## 2021-11-14 ENCOUNTER — Other Ambulatory Visit: Payer: Self-pay

## 2021-11-14 ENCOUNTER — Encounter: Payer: Self-pay | Admitting: Dermatology

## 2021-11-14 DIAGNOSIS — L219 Seborrheic dermatitis, unspecified: Secondary | ICD-10-CM | POA: Diagnosis not present

## 2021-11-14 DIAGNOSIS — L821 Other seborrheic keratosis: Secondary | ICD-10-CM

## 2021-11-14 DIAGNOSIS — Z1283 Encounter for screening for malignant neoplasm of skin: Secondary | ICD-10-CM | POA: Diagnosis not present

## 2021-11-14 DIAGNOSIS — L578 Other skin changes due to chronic exposure to nonionizing radiation: Secondary | ICD-10-CM | POA: Diagnosis not present

## 2021-11-14 DIAGNOSIS — D229 Melanocytic nevi, unspecified: Secondary | ICD-10-CM

## 2021-11-14 DIAGNOSIS — D1800 Hemangioma unspecified site: Secondary | ICD-10-CM

## 2021-11-14 DIAGNOSIS — L814 Other melanin hyperpigmentation: Secondary | ICD-10-CM

## 2021-11-14 NOTE — Progress Notes (Signed)
Follow-Up Visit   Subjective  Karen Kramer is a 41 y.o. female who presents for the following: Annual Exam (Patient here for full body skin exam and skin cancer screening. Patient with no hx of skin cancer. She is not aware of any new or changing spots. ) and Dermatitis (Patient with hx of seb derm. Patient does not have itching but does pick. She uses ketoconazole 2% shampoo three times weekly, advises scalp is about the same. ).  No fhx skin cancer. Patient does have hx of tanning bed use.   The following portions of the chart were reviewed this encounter and updated as appropriate:   Tobacco   Allergies   Meds   Problems   Med Hx   Surg Hx   Fam Hx       Review of Systems:  No other skin or systemic complaints except as noted in HPI or Assessment and Plan.  Objective  Well appearing patient in no apparent distress; mood and affect are within normal limits.  A full examination was performed including scalp, head, eyes, ears, nose, lips, neck, chest, axillae, abdomen, back, buttocks, bilateral upper extremities, bilateral lower extremities, hands, feet, fingers, toes, fingernails, and toenails. All findings within normal limits unless otherwise noted below.  Scalp Minimal scale   Assessment & Plan  Seborrheic dermatitis Scalp  Chronic condition with duration or expected duration over one year. Currently well-controlled.  Continue ketoconazole shampoo apply three times per week, massage into scalp and leave in for 10 minutes before rinsing out   If she calls with worsening or concerns, ok to add clobetasol solution to use daily as needed for flares. Avoid applying to face, groin, and axilla. Use as directed. Long-term use can cause thinning of the skin.  Topical steroids (such as triamcinolone, fluocinolone, fluocinonide, mometasone, clobetasol, halobetasol, betamethasone, hydrocortisone) can cause thinning and lightening of the skin if they are used for too long in the same  area. Your physician has selected the right strength medicine for your problem and area affected on the body. Please use your medication only as directed by your physician to prevent side effects.    Related Medications ketoconazole (NIZORAL) 2 % shampoo Shampoo into the scalp let sit 5-10 minutes before washing out. Use 2-3 days per week.   Lentigines - Scattered tan macules - Due to sun exposure - Benign-appearing, observe - Recommend daily broad spectrum sunscreen SPF 30+ to sun-exposed areas, reapply every 2 hours as needed. - Call for any changes  Seborrheic Keratoses - Stuck-on, waxy, tan-brown papules and/or plaques  - Benign-appearing - Discussed benign etiology and prognosis. - Observe - Call for any changes  Melanocytic Nevi - Tan-brown and/or pink-flesh-colored symmetric macules and papules - Benign appearing on exam today - Observation - Call clinic for new or changing moles - Recommend daily use of broad spectrum spf 30+ sunscreen to sun-exposed areas.   Hemangiomas - Red papules - Discussed benign nature - Observe - Call for any changes  Actinic Damage - Chronic condition, secondary to cumulative UV/sun exposure - diffuse scaly erythematous macules with underlying dyspigmentation - Recommend daily broad spectrum sunscreen SPF 30+ to sun-exposed areas, reapply every 2 hours as needed.  - Staying in the shade or wearing long sleeves, sun glasses (UVA+UVB protection) and wide brim hats (4-inch brim around the entire circumference of the hat) are also recommended for sun protection.  - Call for new or changing lesions.  Skin cancer screening performed today.  Return for TBSE  1-2 years.  Graciella Belton, RMA, am acting as scribe for Forest Gleason, MD .  Documentation: I have reviewed the above documentation for accuracy and completeness, and I agree with the above.  Forest Gleason, MD

## 2021-11-14 NOTE — Patient Instructions (Addendum)
Recommend taking Heliocare sun protection supplement daily in sunny weather for additional sun protection. For maximum protection on the sunniest days, you can take up to 2 capsules of regular Heliocare OR take 1 capsule of Heliocare Ultra. For prolonged exposure (such as a full day in the sun), you can repeat your dose of the supplement 4 hours after your first dose. Heliocare can be purchased at [employer], at some [facility] or at [email or web address].     Melanoma ABCDEs  Melanoma is the most dangerous type of skin cancer, and is the leading cause of death from skin disease.  You are more likely to develop melanoma if you: Have light-colored skin, light-colored eyes, or red or blond hair Spend a lot of time in the sun Tan regularly, either outdoors or in a tanning bed Have had blistering sunburns, especially during childhood Have a close family member who has had a melanoma Have atypical moles or large birthmarks  Early detection of melanoma is key since treatment is typically straightforward and cure rates are extremely high if we catch it early.   The first sign of melanoma is often a change in a mole or a new dark spot.  The ABCDE system is a way of remembering the signs of melanoma.  A for asymmetry:  The two halves do not match. B for border:  The edges of the growth are irregular. C for color:  A mixture of colors are present instead of an even brown color. D for diameter:  Melanomas are usually (but not always) greater than 72mm - the size of a pencil eraser. E for evolution:  The spot keeps changing in size, shape, and color.  Please check your skin once per month between visits. You can use a small mirror in front and a large mirror behind you to keep an eye on the back side or your body.   If you see any new or changing lesions before your next follow-up, please call to schedule a visit.  Please continue daily skin protection including broad spectrum sunscreen SPF 30+ to  sun-exposed areas, reapplying every 2 hours as needed when you're outdoors.    If You Need Anything After Your Visit  If you have any questions or concerns for your doctor, please call our main line at [phone] and press option 4 to reach your doctor's medical assistant. If no one answers, please leave a voicemail as directed and we will return your call as soon as possible. Messages left after 4 pm will be answered the following business day.   You may also send [place] a message via [place]. We typically respond to MyChart messages within 1-2 business days.  For prescription refills, please ask your pharmacy to contact our office. Our fax number is [phone].  If you have an urgent issue when the clinic is closed that cannot wait until the next business day, you can page your doctor at the number below.    Please note that while we do our best to be available for urgent issues outside of office hours, we are not available 24/7.   If you have an urgent issue and are unable to reach [place], you may choose to seek medical care at your doctor's office, retail clinic, urgent care center, or emergency room.  If you have a medical emergency, please immediately call 911 or go to the emergency department.  Pager Numbers  - Dr. Nehemiah Massed: [phone]  - Dr. Laurence Ferrari: [phone]  - Dr. Nicole Kindred: [phone]  In the event of inclement weather, please call our main line at [phone] for an update on the status of any delays or closures.  Dermatology Medication Tips: Please keep the boxes that topical medications come in in order to help keep track of the instructions about where and how to use these. Pharmacies typically print the medication instructions only on the boxes and not directly on the medication tubes.   If your medication is too expensive, please contact our office at [phone] option 4 or send [place] a message through [place].   We are unable to tell what your co-pay for medications  will be in advance as this is different depending on your insurance coverage. However, we may be able to find a substitute medication at lower cost or fill out paperwork to get insurance to cover a needed medication.   If a prior authorization is required to get your medication covered by your insurance company, please allow [place] 1-2 business days to complete this process.  Drug prices often vary depending on where the prescription is filled and some pharmacies may offer cheaper prices.  The website www.goodrx.com contains coupons for medications through different pharmacies. The prices here do not account for what the cost may be with help from insurance (it may be cheaper with your insurance), but the website can give you the price if you did not use any insurance.  - You can print the associated coupon and take it with your prescription to the pharmacy.  - You may also stop by our office during regular business hours and pick up a GoodRx coupon card.  - If you need your prescription sent electronically to a different pharmacy, notify our office through [facility] or by phone at [phone] option 4.     Si Usted Necesita Algo Despus de Su Visita  Tambin puede enviarnos un mensaje a travs de Pharmacist, community. Por lo general respondemos a los mensajes de MyChart en el transcurso de 1 a 2 das hbiles.  Para renovar recetas, por favor pida a su farmacia que se ponga en contacto con nuestra oficina. Harland Dingwall de fax es [place] [phone].  Si tiene un asunto urgente cuando la clnica est cerrada y que no puede esperar hasta el siguiente da hbil, puede llamar/localizar a su doctor(a) al nmero que aparece a continuacin.   Por favor, tenga en cuenta que aunque hacemos todo lo posible para estar disponibles para asuntos urgentes fuera del horario de [place], no estamos disponibles las 24 horas del da, los 7 das de la [place].   Si tiene un problema urgente y no puede comunicarse con  nosotros, puede optar por buscar atencin mdica  en el consultorio de su doctor(a), en una clnica privada, en un centro de atencin urgente o en una sala de emergencias.  Si tiene Engineering geologist, por favor llame inmediatamente al 911 o vaya a la sala de emergencias.  Nmeros de bper  - Dr. Nehemiah Massed: [phone]  - Dra. Moye: [phone]  - Dra. Nicole Kindred: [phone]  En caso de inclemencias del [place], por favor llame a [place]ie Kindred principal al [phone] para una actualizacin sobre el [place] de cualquier retraso o cierre.  Consejos para la medicacin en dermatologa: Por favor, guarde las cajas en las que vienen los medicamentos de uso tpico para ayudarle a seguir las instrucciones sobre dnde y cmo usarlos. Las farmacias generalmente imprimen las instrucciones del medicamento slo en las cajas y no directamente en los tubos del [place].  Si su medicamento es muy caro, por favor, pngase en contacto con Zigmund Daniel llamando al [phone] y presione la opcin 4 o envenos un mensaje a travs de Pharmacist, community.   No podemos decirle cul ser su copago por los medicamentos por adelantado ya que esto es diferente dependiendo de la cobertura de su seguro. Sin embargo, es posible que podamos encontrar un medicamento sustituto a Electrical engineer un formulario para que el seguro cubra el medicamento que se considera necesario.   Si se requiere una autorizacin previa para que su compaa de seguros [place] su medicamento, por favor permtanos de 1 a 2 das hbiles para completar este proceso.  Los precios de los medicamentos varan con frecuencia dependiendo del Environmental consultant de dnde se surte la receta y alguna farmacias pueden ofrecer precios ms baratos.  El sitio web www.goodrx.com tiene cupones para medicamentos de Airline pilot. Los precios aqu no tienen en cuenta lo que podra costar con la ayuda del seguro (puede ser ms barato con su seguro), pero el sitio web  puede darle el precio si no utiliz Research scientist (physical sciences).  - Puede imprimir el cupn correspondiente y llevarlo con su receta a la farmacia.  - Tambin puede pasar por nuestra oficina durante el horario de atencin regular y Charity fundraiser una tarjeta de cupones de GoodRx.  - Si necesita que su receta se enve electrnicamente a una farmacia diferente, informe a nuestra oficina a travs de MyChart de [address] o por telfono llamando al [phone] y presione la opcin 4.

## 2022-01-08 ENCOUNTER — Encounter: Payer: Self-pay | Admitting: Physical Therapy

## 2022-01-08 ENCOUNTER — Ambulatory Visit: Payer: Medicaid Other | Attending: Family Medicine | Admitting: Physical Therapy

## 2022-01-08 DIAGNOSIS — R278 Other lack of coordination: Secondary | ICD-10-CM | POA: Diagnosis present

## 2022-01-08 DIAGNOSIS — M6281 Muscle weakness (generalized): Secondary | ICD-10-CM | POA: Diagnosis present

## 2022-01-08 DIAGNOSIS — R32 Unspecified urinary incontinence: Secondary | ICD-10-CM
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
## 2022-01-22 ENCOUNTER — Ambulatory Visit: Payer: Medicaid Other | Admitting: Physical Therapy

## 2022-01-28 ENCOUNTER — Encounter: Payer: Self-pay | Admitting: Urology

## 2022-01-28 ENCOUNTER — Ambulatory Visit: Payer: Self-pay | Admitting: Urology

## 2022-01-29 ENCOUNTER — Encounter: Payer: Self-pay | Admitting: Physical Therapy

## 2022-01-29 ENCOUNTER — Ambulatory Visit: Payer: Medicaid Other | Attending: Family Medicine | Admitting: Physical Therapy

## 2022-01-29 DIAGNOSIS — R32 Unspecified urinary incontinence: Secondary | ICD-10-CM | POA: Diagnosis present

## 2022-01-29 DIAGNOSIS — R278 Other lack of coordination: Secondary | ICD-10-CM | POA: Diagnosis present

## 2022-01-29 DIAGNOSIS — M6281 Muscle weakness (generalized): Secondary | ICD-10-CM | POA: Diagnosis present

## 2022-01-29 NOTE — Therapy (Signed)
?OUTPATIENT PHYSICAL THERAPY TREATMENT NOTE ? ? ?Patient Name: Karen Kramer ?MRN: [id number] ?DOB:Jul 20, 1981, 41 y.o., female ?Today's Date: 01/29/2022 ? ?PCP: Luciana Axe, NP ?REFERRING PROVIDER: Luciana Axe, NP ? ?END OF SESSION:  ? PT End of Session - 01/29/22 1545   ? ? Visit Number 2   ? Number of Visits 12   ? Date for PT Re-Evaluation 04/02/22   ? Authorization Type IE 01/08/2022   ? PT Start Time 1545   ? PT Stop Time 1625   ? PT Time Calculation (min) 40 min   ? Activity Tolerance Patient tolerated treatment well   ? Behavior During Therapy [facility] for tasks assessed/performed   ? ?  ?  ? ?  ? ? ?Past Medical History:  ?Diagnosis Date  ? Asthma   ? Atypical cervical glandular cells   ? Depression   ? Family history of breast cancer   ? Family history of gene mutation   ? Family history of leukemia   ? Family history of lung cancer   ? Family history of pancreatic cancer   ? Family history of prostate cancer   ? ?Past Surgical History:  ?Procedure Laterality Date  ? ABDOMINAL HYSTERECTOMY    ? Partical  ? TUBAL LIGATION  2008  ? ?Patient Active Problem List  ? Diagnosis Date Noted  ? Genetic testing 05/01/2021  ? 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  ? Family history of gene mutation 04/16/2021  ? Family history of pancreatic cancer 04/16/2021  ? Family history of leukemia 04/16/2021  ? Family history of lung cancer 04/16/2021  ? Family history of breast cancer 04/16/2021  ? Family history of prostate cancer 04/16/2021  ? Seasonal allergic rhinitis due to pollen 06/27/2017  ? Vitamin D deficiency 02/20/2016  ? Overweight 02/20/2016  ? Gastroesophageal reflux disease without esophagitis 01/25/2016  ? Mixed incontinence urge and stress 08/24/2015  ? Anxiety and depression 05/12/2015  ? Insomnia 05/12/2015  ? Infection of urinary tract 11/20/2012  ? Edema 06/11/2012  ? Complicated UTI (urinary tract infection) 03/31/2012  ? Left knee pain 11/22/2011  ? Syncope 05/28/2011  ? LOW BACK PAIN, CHRONIC  05/17/2009  ? SACROILIAC JOINT DYSFUNCTION 05/17/2009  ? TROCHANTERIC BURSITIS, RIGHT 05/17/2009  ? Other specified enthesopathies of unspecified lower limb, excluding foot 05/17/2009  ? Sacrococcygeal disorders, not elsewhere classified 05/17/2009  ? FATIGUE 03/23/2009  ? MIGRAINE, COMMON 04/15/2008  ? ASTHMA, PERSISTENT, MODERATE 04/15/2008  ? Uncomplicated asthma 38/25/0539  ? Anxiety 11/22/1991  ? ? ?REFERRING DIAG: Urge Urinary Incontinence ? ?THERAPY DIAG:  ?Muscle weakness (generalized) ? ?Other lack of coordination ? ?Urinary incontinence, unspecified type ? ?PERTINENT HISTORY: Scoliosis Negative. ?Pulmonary disease/dysfunction Negative. ?Surgical history: Positive for see above. ? ?PRECAUTIONS: None ? ?SUBJECTIVE: Patient denies any changes since evaluation. Patient does note that she may be having UI with intercourse. Patient notes that she feels her clitoris has shifted to the L and notes a lack of sensation vaginally.  ? ?PAIN:  ?Are you having pain? No ? ? ?OBJECTIVE:  ? ?TREATMENT ? ?Pre-treatment assessment: ? ?EXTERNAL PELVIC EXAM: Patient educated on the purpose of the pelvic exam and articulated understanding; patient consented to the exam verbally. ?Breath coordination: present ?Cued Lengthen: no perineal movement ?Cued Contraction: ~2/5 MMT, no perineal ascent, weak squeeze ?Cued Relaxation: full, delayed ?Cough: mild perineal descent ? ?Manual Therapy: ? ? ?Neuromuscular Re-education: ?Patient education on female pelvic anatomy, PFM involvement in sexual arousal and climax cycle.  ?Supine hooklying diaphragmatic breathing  with VCs and TCs for downregulation of the nervous system and improved management of IAP ?Supine hooklying, posterior pelvic tilts with exhalation. VCs and TCs to decrease compensatory patterns and encourage spinal mobility. ?Supine hooklying, PFM contractions x5 with exhalation. VCs and TCs to decrease compensatory patterns and encourage activation of the PFM. ? ? ?Therapeutic  Exercise: ? ? ?Treatments unbilled: ? ?Post-treatment assessment: ? ?Patient educated throughout session on appropriate technique and form using multi-modal cueing, HEP, and activity modification. Patient articulated understanding and returned demonstration. ? ?Patient Response to interventions: ?Notes some difficulty with coordinating HEP, but comfortable to attempt this week. ? ?ASSESSMENT: ? ?Clinical impression: ?Patient presents to clinic with excellent motivation to participate in therapy. Patient demonstrates deficits in PFM coordination, PFM strength, IAP management, and bladder habits. Patient able to achieve posterior pelvic tilt and gentle PFM strengthening with moderate cueing during today's session and responded positively to educational interventions. Patient was encouraged to reach out to urogynecology to schedule evaluation/assessment. Patient will benefit from continued skilled therapeutic intervention to address remaining deficits in PFM coordination, PFM strength, IAP management, and bladder habits in order to increase function and improve overall QOL. ? ? ?Objective impairments: decreased activity tolerance, decreased coordination, decreased endurance, decreased strength, and improper body mechanics.  ? ?Activity limitations: cleaning, community activity, meal prep, occupation, and laundry.  ? ?Personal factors: Behavior pattern, Past/current experiences, Profession, Time since onset of injury/illness/exacerbation, and 3+ comorbidities: anxiety, depression, GERD, sacrococcygeal disorder are also affecting patient's functional outcome.  ? ?Rehab Potential: Good ? ?Clinical decision making: Evolving/moderate complexity ? ?Evaluation complexity: Moderate ? ? ?GOALS: ?Goals reviewed with patient? Yes ? ? ?LONG TERM GOALS: Target date: 04/02/2022 ? ?Patient will demonstrate improved function as evidenced by a score of 59 on FOTO measure for full participation in activities at home and in the community.   ?Baseline: 47 ?Goal status: INITIAL ? ?2.  Patient will demonstrate independence with HEP in order to maximize therapeutic gains and improve carryover from physical therapy sessions to ADLs in the home and community. ?Baseline: not initiated ?Goal status: INITIAL ? ?3.  Patient will demonstrate circumferential and sequential contraction of >4/5 MMT, > 6 sec hold x10 and 5 consecutive quick flicks with </= 10 min rest between testing bouts, and relaxation of the PFM coordinated with breath for improved management of intra-abdominal pressure and normal bowel and bladder function without the presence of pain nor incontinence in order to improve participation at home and in the community. ?Baseline: not formally assessed  ?Goal status: INITIAL ? ?4.  Patient will report confidence in ability to control bladder > 7/10 in order to demonstrate improved function and ability to participate more fully in activities at home and in the community. ?Baseline: not formally assessed  ?Goal status: INITIAL ? ? ? ?PLAN: ?Rehab frequency: 1x/week ? ?Rehab duration: 12 weeks ? ?Planned interventions: Therapeutic exercises, Therapeutic activity, Neuromuscular re-education, Balance training, Gait training, Patient/Family education, Joint mobilization, Orthotic/Fit training, Electrical stimulation, Spinal mobilization, Cryotherapy, Moist heat, scar mobilization, Taping, and Manual therapy ? ? Myles Gip PT, DPT [phone]  ?01/29/2022, 3:46 PM ? ?  ? ?

## 2022-02-05 ENCOUNTER — Encounter: Payer: Medicaid Other | Admitting: Physical Therapy

## 2022-02-12 ENCOUNTER — Ambulatory Visit: Payer: Medicaid Other | Admitting: Physical Therapy

## 2022-02-19 ENCOUNTER — Encounter: Payer: Medicaid Other | Admitting: Physical Therapy

## 2022-02-26 ENCOUNTER — Other Ambulatory Visit: Payer: Self-pay | Admitting: Family Medicine

## 2022-02-26 DIAGNOSIS — Z1231 Encounter for screening mammogram for malignant neoplasm of breast: Secondary | ICD-10-CM

## 2022-03-06 ENCOUNTER — Ambulatory Visit: Payer: Medicaid Other | Attending: Family Medicine | Admitting: Physical Therapy

## 2022-03-06 ENCOUNTER — Encounter: Payer: Self-pay | Admitting: Physical Therapy

## 2022-03-06 DIAGNOSIS — R278 Other lack of coordination: Secondary | ICD-10-CM | POA: Insufficient documentation

## 2022-03-06 DIAGNOSIS — R32 Unspecified urinary incontinence: Secondary | ICD-10-CM | POA: Insufficient documentation

## 2022-03-06 DIAGNOSIS — M6281 Muscle weakness (generalized): Secondary | ICD-10-CM | POA: Diagnosis present

## 2022-03-06 NOTE — Therapy (Signed)
OUTPATIENT PHYSICAL THERAPY TREATMENT NOTE   Patient Name: Karen Kramer MRN: [id number] DOB:1981/03/10, 41 y.o., female Today's Date: 03/06/2022  PCP: Luciana Axe, NP REFERRING PROVIDER: Luciana Axe, NP  END OF SESSION:   PT End of Session - 03/06/22 1533     Visit Number 3    Number of Visits 12    Date for PT Re-Evaluation 04/02/22    Authorization Type IE 01/08/2022    PT Start Time 1530    PT Stop Time 1555    PT Time Calculation (min) 25 min    Activity Tolerance Patient tolerated treatment well    Behavior During Therapy WFL for tasks assessed/performed             Past Medical History:  Diagnosis Date   Asthma    Atypical cervical glandular cells    Depression    Family history of breast cancer    Family history of gene mutation    Family history of leukemia    Family history of lung cancer    Family history of pancreatic cancer    Family history of prostate cancer    Past Surgical History:  Procedure Laterality Date   ABDOMINAL HYSTERECTOMY     Partical   TUBAL LIGATION  2008   Patient Active Problem List   Diagnosis Date Noted   Genetic testing 62/83/6629   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   Family history of gene mutation 04/16/2021   Family history of pancreatic cancer 04/16/2021   Family history of leukemia 04/16/2021   Family history of lung cancer 04/16/2021   Family history of breast cancer 04/16/2021   Family history of prostate cancer 04/16/2021   Seasonal allergic rhinitis due to pollen 06/27/2017   Vitamin D deficiency 02/20/2016   Overweight 02/20/2016   Gastroesophageal reflux disease without esophagitis 01/25/2016   Mixed incontinence urge and stress 08/24/2015   Anxiety and depression 05/12/2015   Insomnia 05/12/2015   Infection of urinary tract 11/20/2012   Edema 47/65/4650   Complicated UTI (urinary tract infection) 03/31/2012   Left knee pain 11/22/2011   Syncope 05/28/2011   LOW BACK PAIN, CHRONIC  05/17/2009   SACROILIAC JOINT DYSFUNCTION 05/17/2009   TROCHANTERIC BURSITIS, RIGHT 05/17/2009   Other specified enthesopathies of unspecified lower limb, excluding foot 05/17/2009   Sacrococcygeal disorders, not elsewhere classified 05/17/2009   FATIGUE 03/23/2009   MIGRAINE, COMMON 04/15/2008   ASTHMA, PERSISTENT, MODERATE 35/46/5681   Uncomplicated asthma 27/51/7001   Anxiety 11/22/1991    REFERRING DIAG: Urge Urinary Incontinence  THERAPY DIAG:  Muscle weakness (generalized)  Urinary incontinence, unspecified type  Other lack of coordination  PERTINENT HISTORY: Scoliosis Negative. Pulmonary disease/dysfunction Negative. Surgical history: Positive for see above.  PRECAUTIONS: None  SUBJECTIVE: Patient denies any changes since evaluation. Patient does note that she may be having UI with intercourse. Patient notes that she feels her clitoris has shifted to the L and notes a lack of sensation vaginally.   PAIN:  Are you having pain? No   OBJECTIVE:   TREATMENT  Pre-treatment assessment:  EXTERNAL PELVIC EXAM: Patient educated on the purpose of the pelvic exam and articulated understanding; patient consented to the exam verbally. Breath coordination: present Cued Contraction: ~2-3/5 MMT, small perineal ascent, weak squeeze Cued Relaxation: full   Manual Therapy:   Neuromuscular Re-education: Supine hooklying diaphragmatic breathing with VCs and TCs for downregulation of the nervous system and improved management of IAP Supine hooklying, PFM contractions x3 with exhalation. VCs and  TCs to decrease compensatory patterns and encourage activation of the PFM. Patient education on the relationship between heightened cortisol levels and frequency of urination for improved understanding of symptoms.  Patient participated in motivational interviewing to identify times where HEP can fit into daily routine.   Therapeutic Exercise:   Treatments unbilled:  Post-treatment  assessment:  Patient educated throughout session on appropriate technique and form using multi-modal cueing, HEP, and activity modification. Patient articulated understanding and returned demonstration.  Patient Response to interventions: Had PFM fasciculations after 3 reps  ASSESSMENT:  Clinical impression: Patient presents to clinic with excellent motivation to participate in therapy. Patient demonstrates deficits in PFM coordination, PFM strength, IAP management, and bladder habits. Patient had mildly improved power of PFM contraction on testing during today's session and responded positively to educational interventions. Patient will benefit from continued skilled therapeutic intervention to address remaining deficits in PFM coordination, PFM strength, IAP management, and bladder habits in order to increase function and improve overall QOL.   Objective impairments: decreased activity tolerance, decreased coordination, decreased endurance, decreased strength, and improper body mechanics.   Activity limitations: cleaning, community activity, meal prep, occupation, and laundry.   Personal factors: Behavior pattern, Past/current experiences, Profession, Time since onset of injury/illness/exacerbation, and 3+ comorbidities: anxiety, depression, GERD, sacrococcygeal disorder are also affecting patient's functional outcome.   Rehab Potential: Good  Clinical decision making: Evolving/moderate complexity  Evaluation complexity: Moderate   GOALS: Goals reviewed with patient? Yes   LONG TERM GOALS: Target date: 04/02/2022  Patient will demonstrate improved function as evidenced by a score of 59 on FOTO measure for full participation in activities at home and in the community.  Baseline: 47 Goal status: INITIAL  2.  Patient will demonstrate independence with HEP in order to maximize therapeutic gains and improve carryover from physical therapy sessions to ADLs in the home and  community. Baseline: not initiated Goal status: INITIAL  3.  Patient will demonstrate circumferential and sequential contraction of >4/5 MMT, > 6 sec hold x10 and 5 consecutive quick flicks with </= 10 min rest between testing bouts, and relaxation of the PFM coordinated with breath for improved management of intra-abdominal pressure and normal bowel and bladder function without the presence of pain nor incontinence in order to improve participation at home and in the community. Baseline: not formally assessed  Goal status: INITIAL  4.  Patient will report confidence in ability to control bladder > 7/10 in order to demonstrate improved function and ability to participate more fully in activities at home and in the community. Baseline: not formally assessed  Goal status: INITIAL    PLAN: Rehab frequency: 1x/week  Rehab duration: 12 weeks  Planned interventions: Therapeutic exercises, Therapeutic activity, Neuromuscular re-education, Balance training, Gait training, Patient/Family education, Joint mobilization, Orthotic/Fit training, Electrical stimulation, Spinal mobilization, Cryotherapy, Moist heat, scar mobilization, Taping, and Manual therapy   Myles Gip PT, DPT [phone]  03/06/2022, 3:34 PM

## 2022-03-19 ENCOUNTER — Ambulatory Visit: Payer: Medicaid Other | Admitting: Physical Therapy

## 2022-03-19 ENCOUNTER — Encounter: Payer: Self-pay | Admitting: Physical Therapy

## 2022-03-19 DIAGNOSIS — R278 Other lack of coordination: Secondary | ICD-10-CM

## 2022-03-19 DIAGNOSIS — M6281 Muscle weakness (generalized): Secondary | ICD-10-CM | POA: Diagnosis not present

## 2022-03-19 DIAGNOSIS — R32 Unspecified urinary incontinence: Secondary | ICD-10-CM

## 2022-03-20 ENCOUNTER — Other Ambulatory Visit: Payer: Self-pay | Admitting: Dermatology

## 2022-03-20 DIAGNOSIS — L219 Seborrheic dermatitis, unspecified: Secondary | ICD-10-CM

## 2022-04-03 ENCOUNTER — Ambulatory Visit: Payer: Medicaid Other | Attending: Family Medicine | Admitting: Physical Therapy

## 2022-04-09 ENCOUNTER — Ambulatory Visit
Admission: RE | Admit: 2022-04-09 | Discharge: 2022-04-09 | Disposition: A | Discharge status: Home / Self Care | Payer: Medicaid Other | Source: Ambulatory Visit | Attending: Family Medicine | Admitting: Family Medicine

## 2022-04-09 DIAGNOSIS — Z1231 Encounter for screening mammogram for malignant neoplasm of breast: Secondary | ICD-10-CM | POA: Diagnosis present

## 2022-07-31 ENCOUNTER — Encounter: Payer: Self-pay | Admitting: Obstetrics and Gynecology

## 2022-07-31 ENCOUNTER — Ambulatory Visit (INDEPENDENT_AMBULATORY_CARE_PROVIDER_SITE_OTHER): Payer: Medicaid Other | Admitting: Obstetrics and Gynecology

## 2022-07-31 VITALS — BP 127/85 | HR 78 | Ht 63.0 in | Wt 139.0 lb

## 2022-07-31 DIAGNOSIS — R350 Frequency of micturition: Secondary | ICD-10-CM | POA: Diagnosis not present

## 2022-07-31 DIAGNOSIS — N393 Stress incontinence (female) (male): Secondary | ICD-10-CM

## 2022-07-31 DIAGNOSIS — N3281 Overactive bladder: Secondary | ICD-10-CM | POA: Diagnosis not present

## 2022-07-31 LAB — POCT URINALYSIS DIPSTICK
Bilirubin, UA: NEGATIVE
Blood, UA: NEGATIVE
Glucose, UA: NEGATIVE
Ketones, UA: NEGATIVE
Leukocytes, UA: NEGATIVE
Nitrite, UA: NEGATIVE
Protein, UA: NEGATIVE
Spec Grav, UA: 1.025 (ref 1.010–1.025)
Urobilinogen, UA: 1 E.U./dL
pH, UA: 6.5 (ref 5.0–8.0)

## 2022-07-31 MED ORDER — SOLIFENACIN SUCCINATE 5 MG PO TABS: 5.0000 mg | ORAL_TABLET | Freq: Every day | ORAL | 5 refills | Status: DC

## 2022-07-31 NOTE — Addendum Note (Signed)
Addended by: Elita Quick on: 07/31/2022 10:26 AM   Modules accepted: Orders

## 2022-07-31 NOTE — Patient Instructions (Addendum)
Today we talked about ways to manage bladder urgency such as altering your diet to avoid irritative beverages and foods (bladder diet) as well as attempting to decrease stress and other exacerbating factors.    The Most Bothersome Foods* The Least Bothersome Foods*  Coffee - Regular & Decaf Tea - caffeinated Carbonated beverages - cola, non-colas, diet & caffeine-free Alcohols - Beer, Red Wine, White Wine, Champagne Fruits - Grapefruit, [place], Orange, [employer] - Cranberry, Grapefruit, Orange, Pineapple Vegetables - Tomato & Tomato Products Flavor Enhancers - Hot peppers, Spicy foods, Chili, Horseradish, Vinegar, Monosodium glutamate (MSG) Artificial Sweeteners - NutraSweet, Sweet 'N Low, Equal (sweetener), Saccharin Ethnic foods - [place], [place], [place] food [employer] - low-fat & whole Fruits - Bananas, Blueberries, Honeydew melon, Pears, Raisins, Watermelon Vegetables - Broccoli, Brussels Sprouts, [place], Carrots, Cauliflower, [place], Cucumber, Mushrooms, Peas, Radishes, Squash, Zucchini, White potatoes, Sweet potatoes & yams Poultry - Chicken, Eggs, [place], [employer] - Beef, Programmer, multimedia, Lamb Seafood - Shrimp, [place] fish, Salmon Grains - Oat, Rice Snacks - Pretzels, Popcorn  *Lissa Morales et al. Diet and its role in interstitial cystitis/bladder pain syndrome (IC/BPS) and comorbid conditions. [place] 2012 Jan 11.   Constipation: Our goal is to achieve formed bowel movements daily or every-other-day.  You may need to try different combinations of the following options to find what works best for you - everybody's body works differently so feel free to adjust the dosages as needed.  Some options to help maintain bowel health include:  Dietary changes (more leafy greens, vegetables and fruits; less processed foods) Fiber supplementation (Benefiber, FiberCon, Metamucil or Psyllium). Start slow and increase gradually to full dose. Over-the-counter agents such as: stool  softeners (Docusate or Colace) and/or laxatives (Miralax, milk of magnesia)  "Power Pudding" is a natural mixture that may help your constipation.  To make blend 1 cup applesauce, 1 cup wheat bran, and 3/4 cup prune juice, refrigerate and then take 1 tablespoon daily with a large glass of water as needed.  For treatment of stress urinary incontinence, which is leakage with physical activity/movement/strainging/coughing, we discussed expectant management versus nonsurgical options versus surgery. Nonsurgical options include weight loss, physical therapy, as well as a pessary.  Surgical options include a midurethral sling, which is a synthetic mesh sling that acts like a hammock under the urethra to prevent leakage of urine, and transurethral injection of a bulking agent.  We discussed the symptoms of overactive bladder (OAB), which include urinary urgency, urinary frequency, night-time urination, with or without urge incontinence.  We discussed management including behavioral therapy (decreasing bladder irritants by following a bladder diet, urge suppression strategies, timed voids, bladder retraining), physical therapy, medication; and for refractory cases posterior tibial nerve stimulation, sacral neuromodulation, and intravesical botulinum toxin injection.
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
## 2022-09-10 NOTE — Progress Notes (Unsigned)
[facility] Return Visit  SUBJECTIVE  History of Present Illness: Karen Kramer is a 41 y.o. female seen in follow-up for OAB. Plan at last visit was decrease bladder irritants and start Vesicare '5mg'$  daily. Reports the vesicare is not working well for her.   Thoughts on SUI treatment: Wants to do the sling for her SUI.  Is having to take changes of clothing   Only urinating about 3-4 times daily.    Past Medical History: Patient  has a past medical history of Asthma, Atypical cervical glandular cells, Depression, Family history of breast cancer, Family history of gene mutation, Family history of leukemia, Family history of lung cancer, Family history of pancreatic cancer, Family history of prostate cancer, and GERD (gastroesophageal reflux disease).   Past Surgical History: She  has a past surgical history that includes Tubal ligation (09/23/2006) and Abdominal hysterectomy.   Medications: She has a current medication list which includes the following prescription(s): azelastine, finasteride, fluticasone, ketoconazole, levocetirizine, mirabegron er, omeprazole, oxymetazoline, topiramate, and vitamin d (ergocalciferol).   Allergies: Patient is allergic to latex.   Social History: Patient  reports that she has never smoked. She does not have any smokeless tobacco history on file. She reports that she does not currently use alcohol. She reports that she does not use drugs.      OBJECTIVE     Physical Exam: Vitals:   09/11/22 0801  BP: 125/87  Pulse: 73   Gen: No apparent distress, A&O x 3.  Detailed Urogynecologic Evaluation:  Deferred.      ASSESSMENT AND PLAN    Karen Kramer is a 41 y.o. with:  1. Overactive bladder   2. SUI (stress urinary incontinence, female)   3. Urinary urgency    Concerns that patient did not feel much sensation with simple CMG. She had 556m instilled into the bladder and still did not feel she was at the level when she would  normally urinate. PVR after urination was 574m She feels the Vesicare is not helping at all. Will try Myrbetriq '25mg'$  for her OAB. Samples given today. Discontinue Vesicare '5mg'$ .  Patient did have leaking during simple CMG with standing at her bladder containing 50049mConcerns related to patient's urgency. We discussed she should try to urinate every 2-3 hours to decrease overfilling of the bladder. 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   Plan to follow up with Dr. SchWannetta Senderr surgical planning for Sling in 4 weeks and simple CMG findings can be further discussed then. She may want to consider urodynamics due to decreased sensation from simple CMG findings.

## 2022-09-11 ENCOUNTER — Encounter: Payer: Self-pay | Admitting: Obstetrics and Gynecology

## 2022-09-11 ENCOUNTER — Ambulatory Visit (INDEPENDENT_AMBULATORY_CARE_PROVIDER_SITE_OTHER): Payer: Medicaid Other | Admitting: Obstetrics and Gynecology

## 2022-09-11 VITALS — BP 125/87 | HR 73

## 2022-09-11 DIAGNOSIS — R3915 Urgency of urination: Secondary | ICD-10-CM | POA: Diagnosis not present

## 2022-09-11 DIAGNOSIS — N3281 Overactive bladder: Secondary | ICD-10-CM | POA: Diagnosis not present

## 2022-09-11 DIAGNOSIS — N393 Stress incontinence (female) (male): Secondary | ICD-10-CM | POA: Diagnosis not present

## 2022-09-11 MED ORDER — MIRABEGRON ER 50 MG PO TB24: 50.0000 mg | ORAL_TABLET | Freq: Every day | ORAL | 5 refills | Status: DC

## 2022-09-11 NOTE — Progress Notes (Signed)
Verbal consent was obtained to perform simple CMG procedure:   PVR after urination was 80m  Prolapse was reduced using 2 large cotton swabs. Urethra was prepped with betadine and a 50F catheter was placed and bladder was drained completely. The bladder was then backfilled with sterile water by gravity.  First sensation: 500 First Desire: Not met Strong Desire: Not met Capacity: 600 Cough stress test was negative while sitting and standing. Valsalva stress test was negative while sitting but positive with standing.  She was was allowed to void on her own.   Interpretation: CMG showed decreased sensation, and increased cystometric capacity. Findings positive for stress incontinence, negative for detrusor overactivity.    Patient is interested in a Sling procedure. Will discuss surgical options with Dr. SWannetta Sender

## 2022-10-25 ENCOUNTER — Ambulatory Visit (INDEPENDENT_AMBULATORY_CARE_PROVIDER_SITE_OTHER): Payer: Medicaid Other | Admitting: Obstetrics and Gynecology

## 2022-10-25 ENCOUNTER — Encounter: Payer: Self-pay | Admitting: Obstetrics and Gynecology

## 2022-10-25 VITALS — BP 131/89 | HR 109

## 2022-10-25 DIAGNOSIS — N3281 Overactive bladder: Secondary | ICD-10-CM | POA: Diagnosis not present

## 2022-10-25 DIAGNOSIS — N393 Stress incontinence (female) (male): Secondary | ICD-10-CM | POA: Diagnosis not present

## 2022-10-25 NOTE — H&P (View-Only) (Signed)
[facility] Return Visit  SUBJECTIVE  History of Present Illness: Karen Kramer is a 42 y.o. female seen in follow-up for mixed incontinence. Last visit, she changed from vesicare to Myrbetriq. She is still having leakage all the time with cough or laugh, larger amounts. Also has leakage on the way to the bathroom.   CMG showed decreased sensation, and increased cystometric capacity. Findings positive for stress incontinence, negative for detrusor overactivity. PVR was 59m.     Past Medical History: Patient  has a past medical history of Asthma, Atypical cervical glandular cells, Depression, Family history of breast cancer, Family history of gene mutation, Family history of leukemia, Family history of lung cancer, Family history of pancreatic cancer, Family history of prostate cancer, and GERD (gastroesophageal reflux disease).   Past Surgical History: She  has a past surgical history that includes Tubal ligation (09/23/2006) and Abdominal hysterectomy.   Medications: She has a current medication list which includes the following prescription(s): azelastine, finasteride, fluticasone, ketoconazole, levocetirizine, mirabegron er, omeprazole, oxymetazoline, topiramate, and vitamin d (ergocalciferol).   Allergies: Patient is allergic to latex.   Social History: Patient  reports that she has never smoked. She does not have any smokeless tobacco history on file. She reports that she does not currently use alcohol. She reports that she does not use drugs.      OBJECTIVE     Physical Exam: Vitals:   10/25/22 1451  BP: 131/89  Pulse: (!) 109   Gen: No apparent distress, A&O x 3.  Detailed Urogynecologic Evaluation:  Deferred. Prior exam showed:  POP-Q   -2.5                                            Aa   -2.5                                           Ba   -9                                              C    4                                            Gh   3                                             Pb   10                                            tvl    -3  Ap   -3                                            Bp                                                  D         ASSESSMENT AND PLAN    Karen Kramer is a 42 y.o. with:  1. SUI (stress urinary incontinence, female)   2. Overactive bladder    - Discussed timed voiding to help prevent bladder getting too full and bladder urgency. OAB symptoms have not improved with medication.  - She prefers to treat SUI and we disucssed options of sling vs urethral bulking.  - For OAB, she may be interested in botox, but I discussed prefer to do a staged procedure due to her decreased bladder sensation. We discussed the possible risk of self-catheterization with botox. She is in agreement with a staged procedure. The botox can be done later in the office.   Plan for surgery: Exam under anesthesia, midurethral sling, cystoscopy  - We reviewed the patient's specific anatomic and functional findings, with the assistance of diagrams, and together finalized the above procedure. The planned surgical procedures were discussed along with the surgical risks outlined below, which were also provided on a detailed handout. Additional treatment options including expectant management, conservative management, medical management were discussed where appropriate.  We reviewed the benefits and risks of each treatment option.   General Surgical Risks: For all procedures, there are risks of bleeding, infection, damage to surrounding organs including but not limited to bowel, bladder, blood vessels, ureters and nerves, and need for further surgery if an injury were to occur. These risks are all low with minimally invasive surgery.   There are risks of numbness and weakness at any body site or buttock/rectal pain.  It is possible that baseline pain can be worsened by surgery,  either with or without mesh. If surgery is vaginal, there is also a low risk of possible conversion to laparoscopy or open abdominal incision where indicated. Very rare risks include blood transfusion, blood clot, heart attack, pneumonia, or death.   There is also a risk of short-term postoperative urinary retention with need to use a catheter. About half of patients need to go home from surgery with a catheter, which is then later removed in the office. The risk of long-term need for a catheter is very low. There is also a risk of worsening of overactive bladder.   Sling: The effectiveness of a midurethral vaginal mesh sling is approximately 85%, and thus, there will be times when you may leak urine after surgery, especially if your bladder is full or if you have a strong cough. There is a balance between making the sling tight enough to treat your leakage but not too tight so that you have long-term difficulty emptying your bladder. A mesh sling will not directly treat overactive bladder/urge incontinence and may worsen it.  There is an FDA safety notification on vaginal mesh procedures for prolapse but NOT mesh slings. We have extensive experience and training with mesh placement and we have close postoperative follow up to identify any potential complications from mesh.  It is important to realize that this mesh is a permanent implant that cannot be easily removed. There are rare risks of mesh exposure (2-4%), pain with intercourse (0-7%), and infection (<1%). The risk of mesh exposure if more likely in a woman with risks for poor healing (prior radiation, poorly controlled diabetes, or immunocompromised). The risk of new or worsened chronic pain after mesh implant is more common in women with baseline chronic pain and/or poorly controlled anxiety or depression. Approximately 2-4% of patients will experience longer-term post-operative voiding dysfunction that may require surgical revision of the sling. We  also reviewed that postoperatively, her stream may not be as strong as before surgery.   - For preop Visit:  She is required to have a visit within 30 days of her surgery.    - Medical clearance: not required  - Anticoagulant use: No - Medicaid Hysterectomy form: n/a - Accepts blood transfusion: Yes - Expected length of stay: outpatient  Request sent for surgery scheduling.   Jaquita Folds, MD

## 2022-10-25 NOTE — Progress Notes (Signed)
[facility] Return Visit  SUBJECTIVE  History of Present Illness: Karen Kramer is a 42 y.o. female seen in follow-up for mixed incontinence. Last visit, she changed from vesicare to Myrbetriq. She is still having leakage all the time with cough or laugh, larger amounts. Also has leakage on the way to the bathroom.   CMG showed decreased sensation, and increased cystometric capacity. Findings positive for stress incontinence, negative for detrusor overactivity. PVR was 5ml.     Past Medical History: Patient  has a past medical history of Asthma, Atypical cervical glandular cells, Depression, Family history of breast cancer, Family history of gene mutation, Family history of leukemia, Family history of lung cancer, Family history of pancreatic cancer, Family history of prostate cancer, and GERD (gastroesophageal reflux disease).   Past Surgical History: She  has a past surgical history that includes Tubal ligation (09/23/2006) and Abdominal hysterectomy.   Medications: She has a current medication list which includes the following prescription(s): azelastine, finasteride, fluticasone, ketoconazole, levocetirizine, mirabegron er, omeprazole, oxymetazoline, topiramate, and vitamin d (ergocalciferol).   Allergies: Patient is allergic to latex.   Social History: Patient  reports that she has never smoked. She does not have any smokeless tobacco history on file. She reports that she does not currently use alcohol. She reports that she does not use drugs.      OBJECTIVE     Physical Exam: Vitals:   10/25/22 1451  BP: 131/89  Pulse: (!) 109   Gen: No apparent distress, A&O x 3.  Detailed Urogynecologic Evaluation:  Deferred. Prior exam showed:  POP-Q   -2.5                                            Aa   -2.5                                           Ba   -9                                              C    4                                            Gh   3                                             Pb   10                                            tvl    -3                                              Ap   -3                                            Bp                                                  D         ASSESSMENT AND PLAN    Karen Kramer is a 42 y.o. with:  1. SUI (stress urinary incontinence, female)   2. Overactive bladder    - Discussed timed voiding to help prevent bladder getting too full and bladder urgency. OAB symptoms have not improved with medication.  - She prefers to treat SUI and we disucssed options of sling vs urethral bulking.  - For OAB, she may be interested in botox, but I discussed prefer to do a staged procedure due to her decreased bladder sensation. We discussed the possible risk of self-catheterization with botox. She is in agreement with a staged procedure. The botox can be done later in the office.   Plan for surgery: Exam under anesthesia, midurethral sling, cystoscopy  - We reviewed the patient's specific anatomic and functional findings, with the assistance of diagrams, and together finalized the above procedure. The planned surgical procedures were discussed along with the surgical risks outlined below, which were also provided on a detailed handout. Additional treatment options including expectant management, conservative management, medical management were discussed where appropriate.  We reviewed the benefits and risks of each treatment option.   General Surgical Risks: For all procedures, there are risks of bleeding, infection, damage to surrounding organs including but not limited to bowel, bladder, blood vessels, ureters and nerves, and need for further surgery if an injury were to occur. These risks are all low with minimally invasive surgery.   There are risks of numbness and weakness at any body site or buttock/rectal pain.  It is possible that baseline pain can be worsened by surgery,  either with or without mesh. If surgery is vaginal, there is also a low risk of possible conversion to laparoscopy or open abdominal incision where indicated. Very rare risks include blood transfusion, blood clot, heart attack, pneumonia, or death.   There is also a risk of short-term postoperative urinary retention with need to use a catheter. About half of patients need to go home from surgery with a catheter, which is then later removed in the office. The risk of long-term need for a catheter is very low. There is also a risk of worsening of overactive bladder.   Sling: The effectiveness of a midurethral vaginal mesh sling is approximately 85%, and thus, there will be times when you may leak urine after surgery, especially if your bladder is full or if you have a strong cough. There is a balance between making the sling tight enough to treat your leakage but not too tight so that you have long-term difficulty emptying your bladder. A mesh sling will not directly treat overactive bladder/urge incontinence and may worsen it.  There is an FDA safety notification on vaginal mesh procedures for prolapse but NOT mesh slings. We have extensive experience and training with mesh placement and we have close postoperative follow up to identify any potential complications from mesh.   It is important to realize that this mesh is a permanent implant that cannot be easily removed. There are rare risks of mesh exposure (2-4%), pain with intercourse (0-7%), and infection (<1%). The risk of mesh exposure if more likely in a woman with risks for poor healing (prior radiation, poorly controlled diabetes, or immunocompromised). The risk of new or worsened chronic pain after mesh implant is more common in women with baseline chronic pain and/or poorly controlled anxiety or depression. Approximately 2-4% of patients will experience longer-term post-operative voiding dysfunction that may require surgical revision of the sling. We  also reviewed that postoperatively, her stream may not be as strong as before surgery.   - For preop Visit:  She is required to have a visit within 30 days of her surgery.    - Medical clearance: not required  - Anticoagulant use: No - Medicaid Hysterectomy form: n/a - Accepts blood transfusion: Yes - Expected length of stay: outpatient  Request sent for surgery scheduling.   Karen Yusuf N Schneur Crowson, MD   

## 2022-10-29 ENCOUNTER — Encounter (HOSPITAL_BASED_OUTPATIENT_CLINIC_OR_DEPARTMENT_OTHER): Payer: Self-pay | Admitting: Obstetrics and Gynecology

## 2022-10-29 NOTE — Progress Notes (Signed)
Spoke w/ via phone for pre-op interview--- [facility] needs dos---- NONE              Lab results------ COVID test -----patient states asymptomatic no test needed Arrive at -------1215 NPO after MN NO Solid Food.  Clear liquids from MN until---1115 Med rec completed Medications to take morning of surgery -----Omeprazole and Propranolol PRN Diabetic medication ----- Patient instructed no nail polish to be worn day of surgery Patient instructed to bring photo id and insurance card day of surgery Patient aware to have Driver (ride ) / caregiver  Jabier Mutton  for 24 hours after surgery  Patient Special Instructions ----- Pre-Op special Istructions ----- Patient verbalized understanding of instructions that were given at this phone interview. Patient denies shortness of breath, chest pain, fever, cough at this phone interview.

## 2022-11-07 ENCOUNTER — Other Ambulatory Visit: Payer: Self-pay

## 2022-11-07 ENCOUNTER — Encounter (HOSPITAL_BASED_OUTPATIENT_CLINIC_OR_DEPARTMENT_OTHER): Payer: Self-pay | Admitting: Obstetrics and Gynecology

## 2022-11-07 ENCOUNTER — Ambulatory Visit (HOSPITAL_BASED_OUTPATIENT_CLINIC_OR_DEPARTMENT_OTHER): Payer: Medicaid Other | Admitting: Anesthesiology

## 2022-11-07 ENCOUNTER — Encounter (HOSPITAL_BASED_OUTPATIENT_CLINIC_OR_DEPARTMENT_OTHER)
Admission: RE | Disposition: A | Discharge status: Home / Self Care | Payer: Self-pay | Source: Home / Self Care | Attending: Obstetrics and Gynecology

## 2022-11-07 ENCOUNTER — Ambulatory Visit (HOSPITAL_BASED_OUTPATIENT_CLINIC_OR_DEPARTMENT_OTHER)
Admission: RE | Admit: 2022-11-07 | Discharge: 2022-11-07 | Disposition: A | Discharge status: Home / Self Care | Payer: Medicaid Other | Attending: Obstetrics and Gynecology | Admitting: Obstetrics and Gynecology

## 2022-11-07 DIAGNOSIS — K219 Gastro-esophageal reflux disease without esophagitis: Secondary | ICD-10-CM | POA: Insufficient documentation

## 2022-11-07 DIAGNOSIS — R519 Headache, unspecified: Secondary | ICD-10-CM | POA: Insufficient documentation

## 2022-11-07 DIAGNOSIS — N393 Stress incontinence (female) (male): Secondary | ICD-10-CM | POA: Diagnosis not present

## 2022-11-07 DIAGNOSIS — N3281 Overactive bladder: Secondary | ICD-10-CM | POA: Diagnosis not present

## 2022-11-07 DIAGNOSIS — Z01818 Encounter for other preprocedural examination: Secondary | ICD-10-CM

## 2022-11-07 HISTORY — DX: Anxiety disorder, unspecified: F41.9

## 2022-11-07 HISTORY — PX: BLADDER SUSPENSION: SHX72

## 2022-11-07 HISTORY — PX: CYSTOSCOPY: SHX5120

## 2022-11-07 SURGERY — URETHROPEXY, USING TRANSVAGINAL TAPE
Anesthesia: General | Site: Vagina

## 2022-11-07 MED ORDER — PHENAZOPYRIDINE HCL 100 MG PO TABS
ORAL_TABLET | ORAL | Status: AC
  Filled 2022-11-07: qty 2

## 2022-11-07 MED ORDER — PROPOFOL 10 MG/ML IV BOLUS
INTRAVENOUS | Status: AC
  Filled 2022-11-07: qty 20

## 2022-11-07 MED ORDER — LIDOCAINE-EPINEPHRINE 1 %-1:100000 IJ SOLN
INTRAMUSCULAR | Status: DC | PRN
  Administered 2022-11-07: 10 mL

## 2022-11-07 MED ORDER — OXYCODONE HCL 5 MG/5ML PO SOLN: 5.0000 mg | Freq: Once | ORAL | Status: DC | PRN

## 2022-11-07 MED ORDER — FENTANYL CITRATE (PF) 100 MCG/2ML IJ SOLN: 25.0000 ug | INTRAMUSCULAR | Status: DC | PRN

## 2022-11-07 MED ORDER — OXYCODONE HCL 5 MG PO TABS: 5.0000 mg | ORAL_TABLET | ORAL | 0 refills | Status: DC | PRN

## 2022-11-07 MED ORDER — 0.9 % SODIUM CHLORIDE (POUR BTL) OPTIME
TOPICAL | Status: DC | PRN
  Administered 2022-11-07: 500 mL

## 2022-11-07 MED ORDER — ACETAMINOPHEN 500 MG PO TABS
1000.0000 mg | ORAL_TABLET | Freq: Once | ORAL | Status: AC
  Administered 2022-11-07: 1000 mg via ORAL

## 2022-11-07 MED ORDER — ROCURONIUM BROMIDE 10 MG/ML (PF) SYRINGE
PREFILLED_SYRINGE | INTRAVENOUS | Status: AC
  Filled 2022-11-07: qty 10

## 2022-11-07 MED ORDER — ACETAMINOPHEN 500 MG PO TABS: 500.0000 mg | ORAL_TABLET | Freq: Four times a day (QID) | ORAL | 0 refills | Status: AC | PRN

## 2022-11-07 MED ORDER — MIDAZOLAM HCL 2 MG/2ML IJ SOLN
INTRAMUSCULAR | Status: AC
  Filled 2022-11-07: qty 2

## 2022-11-07 MED ORDER — PHENAZOPYRIDINE HCL 100 MG PO TABS
200.0000 mg | ORAL_TABLET | ORAL | Status: AC
  Administered 2022-11-07: 200 mg via ORAL

## 2022-11-07 MED ORDER — ONDANSETRON HCL 4 MG/2ML IJ SOLN
INTRAMUSCULAR | Status: AC
  Filled 2022-11-07: qty 2

## 2022-11-07 MED ORDER — IBUPROFEN 600 MG PO TABS: 600.0000 mg | ORAL_TABLET | Freq: Four times a day (QID) | ORAL | 0 refills | Status: AC | PRN

## 2022-11-07 MED ORDER — PHENYLEPHRINE 80 MCG/ML (10ML) SYRINGE FOR IV PUSH (FOR BLOOD PRESSURE SUPPORT)
PREFILLED_SYRINGE | INTRAVENOUS | Status: AC
  Filled 2022-11-07: qty 10

## 2022-11-07 MED ORDER — LIDOCAINE HCL (PF) 2 % IJ SOLN
INTRAMUSCULAR | Status: AC
  Filled 2022-11-07: qty 5

## 2022-11-07 MED ORDER — PROPOFOL 10 MG/ML IV BOLUS
INTRAVENOUS | Status: DC | PRN
  Administered 2022-11-07: 150 mg via INTRAVENOUS

## 2022-11-07 MED ORDER — OXYCODONE HCL 5 MG PO TABS: 5.0000 mg | ORAL_TABLET | Freq: Once | ORAL | Status: DC | PRN

## 2022-11-07 MED ORDER — FENTANYL CITRATE (PF) 100 MCG/2ML IJ SOLN
INTRAMUSCULAR | Status: AC
  Filled 2022-11-07: qty 2

## 2022-11-07 MED ORDER — LACTATED RINGERS IV SOLN: INTRAVENOUS | Status: DC

## 2022-11-07 MED ORDER — KETOROLAC TROMETHAMINE 30 MG/ML IJ SOLN
INTRAMUSCULAR | Status: DC | PRN
  Administered 2022-11-07: 30 mg via INTRAVENOUS

## 2022-11-07 MED ORDER — SODIUM CHLORIDE 0.9 % IR SOLN
Status: DC | PRN
  Administered 2022-11-07: 900 mL via INTRAVESICAL

## 2022-11-07 MED ORDER — FENTANYL CITRATE (PF) 100 MCG/2ML IJ SOLN
INTRAMUSCULAR | Status: DC | PRN
  Administered 2022-11-07: 25 ug via INTRAVENOUS
  Administered 2022-11-07: 50 ug via INTRAVENOUS
  Administered 2022-11-07: 25 ug via INTRAVENOUS

## 2022-11-07 MED ORDER — DEXAMETHASONE SODIUM PHOSPHATE 10 MG/ML IJ SOLN
INTRAMUSCULAR | Status: DC | PRN
  Administered 2022-11-07: 10 mg via INTRAVENOUS

## 2022-11-07 MED ORDER — POLYETHYLENE GLYCOL 3350 17 GM/SCOOP PO POWD: 17.0000 g | Freq: Every day | ORAL | 0 refills | Status: DC

## 2022-11-07 MED ORDER — CEFAZOLIN SODIUM-DEXTROSE 2-4 GM/100ML-% IV SOLN
2.0000 g | INTRAVENOUS | Status: AC
  Administered 2022-11-07: 2 g via INTRAVENOUS

## 2022-11-07 MED ORDER — ACETAMINOPHEN 500 MG PO TABS
ORAL_TABLET | ORAL | Status: AC
  Filled 2022-11-07: qty 2

## 2022-11-07 MED ORDER — DEXAMETHASONE SODIUM PHOSPHATE 10 MG/ML IJ SOLN
INTRAMUSCULAR | Status: AC
  Filled 2022-11-07: qty 1

## 2022-11-07 MED ORDER — LIDOCAINE 2% (20 MG/ML) 5 ML SYRINGE
INTRAMUSCULAR | Status: DC | PRN
  Administered 2022-11-07: 80 mg via INTRAVENOUS

## 2022-11-07 MED ORDER — MIDAZOLAM HCL 2 MG/2ML IJ SOLN
INTRAMUSCULAR | Status: DC | PRN
  Administered 2022-11-07: 2 mg via INTRAVENOUS

## 2022-11-07 MED ORDER — CEFAZOLIN SODIUM-DEXTROSE 2-4 GM/100ML-% IV SOLN
INTRAVENOUS | Status: AC
  Filled 2022-11-07: qty 100

## 2022-11-07 MED ORDER — ONDANSETRON HCL 4 MG/2ML IJ SOLN
INTRAMUSCULAR | Status: DC | PRN
  Administered 2022-11-07: 4 mg via INTRAVENOUS

## 2022-11-07 MED ORDER — POVIDONE-IODINE 10 % EX SWAB: 2.0000 | Freq: Once | CUTANEOUS | Status: DC

## 2022-11-07 MED ORDER — PROMETHAZINE HCL 25 MG/ML IJ SOLN: 6.2500 mg | INTRAMUSCULAR | Status: DC | PRN

## 2022-11-07 SURGICAL SUPPLY — 37 items
ADH SKN CLS APL DERMABOND .7 (GAUZE/BANDAGES/DRESSINGS) ×2
AGENT HMST KT MTR STRL THRMB (HEMOSTASIS)
BLADE CLIPPER SENSICLIP SURGIC (BLADE) ×2 IMPLANT
BLADE SURG 15 STRL LF DISP TIS (BLADE) ×2 IMPLANT
BLADE SURG 15 STRL SS (BLADE) ×2
DERMABOND ADVANCED .7 DNX12 (GAUZE/BANDAGES/DRESSINGS) ×2 IMPLANT
ELECT REM PT RETURN 9FT ADLT (ELECTROSURGICAL)
ELECTRODE REM PT RTRN 9FT ADLT (ELECTROSURGICAL) IMPLANT
GAUZE 4X4 16PLY ~~LOC~~+RFID DBL (SPONGE) IMPLANT
GLOVE BIOGEL PI IND STRL 6.5 (GLOVE) ×2 IMPLANT
GLOVE ECLIPSE 6.0 STRL STRAW (GLOVE) ×2 IMPLANT
GOWN STRL REUS W/TWL LRG LVL3 (GOWN DISPOSABLE) ×2 IMPLANT
HIBICLENS CHG 4% 4OZ BTL (MISCELLANEOUS) ×2 IMPLANT
HOLDER FOLEY CATH W/STRAP (MISCELLANEOUS) ×2 IMPLANT
IV NS 1000ML (IV SOLUTION) ×2
IV NS 1000ML BAXH (IV SOLUTION) IMPLANT
KIT TURNOVER CYSTO (KITS) ×2 IMPLANT
MANIFOLD NEPTUNE II (INSTRUMENTS) ×2 IMPLANT
NEEDLE HYPO 22GX1.5 SAFETY (NEEDLE) ×2 IMPLANT
NS IRRIG 1000ML POUR BTL (IV SOLUTION) ×2 IMPLANT
NS IRRIG 500ML POUR BTL (IV SOLUTION) IMPLANT
PACK CYSTO (CUSTOM PROCEDURE TRAY) ×2 IMPLANT
PACK VAGINAL WOMENS (CUSTOM PROCEDURE TRAY) ×2 IMPLANT
RETRACTOR LONE STAR DISPOSABLE (INSTRUMENTS) ×2 IMPLANT
RETRACTOR STAY HOOK 5MM (MISCELLANEOUS) ×2 IMPLANT
SET IRRIG Y TYPE TUR BLADDER L (SET/KITS/TRAYS/PACK) ×2 IMPLANT
SPIKE FLUID TRANSFER (MISCELLANEOUS) ×2 IMPLANT
SUCTION FRAZIER HANDLE 10FR (MISCELLANEOUS) ×2
SUCTION TUBE FRAZIER 10FR DISP (MISCELLANEOUS) ×2 IMPLANT
SURGIFLO W/THROMBIN 8M KIT (HEMOSTASIS) IMPLANT
SUT ABS MONO DBL WITH NDL 48IN (SUTURE) IMPLANT
SUT VIC AB 2-0 SH 27 (SUTURE) ×2
SUT VIC AB 2-0 SH 27XBRD (SUTURE) ×2 IMPLANT
SYR BULB EAR ULCER 3OZ GRN STR (SYRINGE) ×2 IMPLANT
SYS SLING ADV FIT BLUE TRNSVAG (Sling) IMPLANT
TOWEL OR 17X26 10 PK STRL BLUE (TOWEL DISPOSABLE) ×2 IMPLANT
TRAY FOLEY W/BAG SLVR 14FR (SET/KITS/TRAYS/PACK) ×2 IMPLANT

## 2022-11-07 NOTE — Op Note (Signed)
Operative Note  Preoperative Diagnosis: stress urinary incontinence  Postoperative Diagnosis: same  Procedures performed:  Midurethral sling (advantge fit), cystoscopy  Implants:  Implant Name Type Inv. Item Serial No. Manufacturer Lot No. LRB No. Used Action  SYS SLING ADV FIT BLUE TRNSVAG - [id number] Sling SYS SLING ADV FIT BLUE TRNSVAG  Wilkie Aye [id number] N/A 1 Implanted    Attending Surgeon: Sherlene Shams, MD  Anesthesia: General LMA  Findings: On cystoscopy, normal bladder and urethra without injury, lesion or foreign body. Brisk bilateral ureteral efflux present.    Specimens: none  Estimated blood loss: 25 mL  IV fluids: see flowsheet  Urine output: 123XX123 mL  Complications: none  Procedure in Detail:  After informed consent was obtained, the patient was taken to the operating room where she was placed under anesthesia.  She was then placed in the dorsal lithotomy position with allen stirrups,  and prepped and draped in the usual sterile fashion.  A strap was placed across her upper abdomen on top of her gown so it was not in direct contact with her skin.  Care was taken to avoid hyperflexion or hyperextension of her upper and lower extremities. A foley catheter was placed.  A lonestar self-retraining retractor was placed with 4 stay hooks. The mid urethral area was located on the anterior vaginal wall.  Two Allis clamps were placed at the level of the midurethra. 1% lidocaine with epinephrine was injected into the vaginal mucosa. A vertical incision was made between the two clamps using a 15-blade scalpel.  Using sharp dissection, Metzenbaum scissors were used to make a periurethral tunnel from the vaginal incision towards the pubic rami bilaterally for the future sling tracts. The bladder was ensured to be empty. The trocar and attached sling were introduced into the right side of the periurethral vaginal incision, just inferior to the pubic symphysis on the right  side. The trocar was guided through the endopelvic fascia and directly vertically.  While hugging the cephalad surface of the pubic bone, the trocar was guided out through the abdomen 2 fingerbreadths lateral to midline at the level of the pubic symphysis on the ipsilateral side. The trocar was placed on the left side in a similar fashion.  A 70-degree cystoscope was introduced, and 360-degree inspection revealed no trauma or trocars in the bladder, with brisk bilateral ureteral efflux.  However the left side trocar appeared to tether the bladder so decision was made to remove this arm. The bladder was drained and the cystoscope was removed.  The Foley catheter was reinserted. The left side trocar was passed again, ensuring it was hugging the pubic bone. Repeat cystoscopy was performed and there appeared to be no tethering from either side of the sling. Cystoscope was again removed and the catheter was replaced.  The sling was brought to lie beneath the mid-urethra.  A needle driver was placed behind the sling to ensure no tension.   The plastic sheath was removed from the sling and the distal ends of the sling were trimmed just below the level of the skin incisions.  Tension-free positioning of the sling was confirmed. Vaginal inspection revealed no vaginotomy or sling perforations of the mucosa.  The vaginal mucosal edges were reapproximated using 2-0 Vicryl.  The vagina was copiously irrigated.  Hemostasis was again noted. Vaginal packing not placed. The suprapubic sling incisions were closed with Dermabond. The patient tolerated the procedure well.  She was awakened from anesthesia and transferred to the recovery room in stable  condition. All needle and sponge counts were correct x 2.    Karen Folds, MD

## 2022-11-07 NOTE — Discharge Instructions (Addendum)
POST OPERATIVE INSTRUCTIONS  General Instructions Recovery (not bed rest) will last approximately 6 weeks Walking is encouraged, but refrain from strenuous exercise/ housework/ heavy lifting for 2 weeks. No lifting >10lbs  Nothing in the vagina for 6 weeks- NO intercourse, tampons or douching Bathing:  Do not submerge in water (NO swimming, bath, hot tub, etc) until after your postop visit. You can shower starting the day after surgery.  No driving until you are not taking narcotic pain medicine and until your pain is well enough controlled that you can slam on the breaks or make sudden movements if needed.   Taking your medications Please take your acetaminophen and ibuprofen on a schedule for the first 48 hours. Take 647m ibuprofen, then take 5038macetaminophen 3 hours later, then continue to alternate ibuprofen and acetaminophen. That way you are taking each type of medication every 6 hours. Take the prescribed narcotic (oxycodone, tramadol, etc) as needed, with a maximum being every 4 hours.  Take a stool softener daily to keep your stools soft and preventing you from straining. If you have diarrhea, you decrease your stool softener. This is explained more below. We have prescribed you Miralax.  Reasons to Call the Nurse (see last page for phone numbers) Heavy Bleeding (changing your pad every 1-2 hours) Persistent nausea/vomiting Fever (100.4 degrees or more) Incision problems (pus or other fluid coming out, redness, warmth, increased pain)  Things to Expect After Surgery Mild to Moderate pain is normal during the first day or two after surgery. If prescribed, take Ibuprofen or Tylenol first and use the stronger medicine for "break-through" pain. You can overlap these medicines because they work differently.   Constipation   To Prevent Constipation:  Eat a well-balanced diet including protein, grains, fresh fruit and vegetables.  Drink plenty of fluids. Walk regularly.  Depending on  specific instructions from your physician: take Miralax daily and additionally you can add a stool softener (colace/ docusate) and fiber supplement. Continue as long as you're on pain medications.   To Treat Constipation:  If you do not have a bowel movement in 2 days after surgery, you can take 2 Tbs of Milk of Magnesia 1-2 times a day until you have a bowel movement. If diarrhea occurs, decrease the amount or stop the laxative. If no results with Milk of Magnesia, you can drink a bottle of magnesium citrate which you can purchase over the counter.  Fatigue:  This is a normal response to surgery and will improve with time.  Plan frequent rest periods throughout the day.  Gas Pain:  This is very common but can also be very painful! Drink warm liquids such as herbal teas, bouillon or soup. Walking will help you pass more gas.  Mylicon or Gas-X can be taken over the counter.  Leaking Urine:  Varying amounts of leakage may occur after surgery.  This should improve with time. Your bladder needs at least 3 months to recover from surgery. If you leak after surgery, be sure to mention this to your doctor at your post-op visit. If you were taking medications for overactive bladder prior to surgery, be sure to restart the medications immediately after surgery.  Incisions: If you have incisions on your abdomen, the skin glue will dissolve on its own over time. It is ok to gently rinse with soap and water over these incisions but do not scrub.  Catheter Approximately 50% of patients are unable to urinate after surgery and need to go home with a  catheter. This allows your bladder to rest so it can return to full function. If you go home with a catheter, the office will call to set up a voiding trial a few days after surgery. For most patients, by this visit, they are able to urinate on their own. Long term catheter use is rare.   Return to Work  As work demands and recovery times vary widely, it is hard to  predict when you will want to return to work. If you have a desk job with no strenuous physical activity, and if you would like to return sooner than generally recommended, discuss this with your provider or call our office.   Post op concerns  For non-emergent issues, please call the Urogynecology Nurse. Please leave a message and someone will contact you within one business day.  You can also send a message through [facility] [place].   AFTER HOURS (After 5:00 PM and on weekends):  For urgent matters that cannot wait until the next business day. Call our office [phone] and connect to the doctor on call.  Please reserve this for important issues.   **FOR ANY TRUE EMERGENCY ISSUES CALL 911 OR GO TO THE NEAREST EMERGENCY ROOM.** Please inform our office or the doctor on call of any emergency.     APPOINTMENTS: Call [phone]       No acetaminophen/Tylenol until after 6:45 pm today if needed.  No ibuprofen, Advil, Aleve, Motrin, ketorolac, meloxicam, naproxen, or other NSAIDS until after 8:30 pm today if needed.   Post Anesthesia Home Care Instructions  Activity: Get plenty of rest for the remainder of the day. A responsible individual must stay with you for 24 hours following the procedure.  For the next 24 hours, DO NOT: -Drive a car -Paediatric nurse -Drink alcoholic beverages -Take any medication unless instructed by your physician -Make any legal decisions or sign important papers.  Meals: Start with liquid foods such as gelatin or soup. Progress to regular foods as tolerated. Avoid greasy, spicy, heavy foods. If nausea and/or vomiting occur, drink only clear liquids until the nausea and/or vomiting subsides. Call your physician if vomiting continues.  Special Instructions/Symptoms: Your throat may feel dry or sore from the anesthesia or the breathing tube placed in your throat during surgery. If this causes discomfort, gargle with warm salt water. The discomfort should  disappear within 24 hours.

## 2022-11-07 NOTE — Transfer of Care (Signed)
Immediate Anesthesia Transfer of Care Note  Patient: Karen Kramer  Procedure(s) Performed: Procedure(s) (LRB): TRANSVAGINAL TAPE (TVT) PROCEDURE (N/A) CYSTOSCOPY (N/A)  Patient Location: PACU  Anesthesia Type: General  Level of Consciousness: awake, oriented, sedated and patient cooperative  Airway & Oxygen Therapy: Patient Spontanous Breathing and Patient connected to face mask oxygen  Post-op Assessment: Report given to PACU RN and Post -op Vital signs reviewed and stable  Post vital signs: Reviewed and stable  Complications: No apparent anesthesia complications  Last Vitals:  Vitals Value Taken Time  BP    Temp    Pulse 87 11/07/22 1503  Resp 15 11/07/22 1503  SpO2 97 % 11/07/22 1503  Vitals shown include unvalidated device data.  Last Pain:  Vitals:   11/07/22 1237  TempSrc: Oral  PainSc: 0-No pain      Patients Stated Pain Goal: 5 ([id number] [id number])  Complications: No notable events documented.

## 2022-11-07 NOTE — Anesthesia Preprocedure Evaluation (Addendum)
Anesthesia Evaluation  Patient identified by MRN, date of birth, ID band Patient awake    Reviewed: Allergy & Precautions, NPO status , Patient's Chart, lab work & pertinent test results  History of Anesthesia Complications Negative for: history of anesthetic complications  Airway Mallampati: I  TM Distance: >3 FB Neck ROM: Full    Dental  (+) Teeth Intact, Dental Advisory Given   Pulmonary neg shortness of breath, asthma (patient denies) , neg sleep apnea, neg COPD, neg recent URI   Pulmonary exam normal breath sounds clear to auscultation       Cardiovascular negative cardio ROS  Rhythm:Regular Rate:Normal     Neuro/Psych  Headaches, neg Seizures PSYCHIATRIC DISORDERS (insomnia) Anxiety Depression     Neuromuscular disease (low back pain)    GI/Hepatic Neg liver ROS,GERD  Medicated,,  Endo/Other  negative endocrine ROS    Renal/GU negative Renal ROS     Musculoskeletal   Abdominal   Peds  Hematology negative hematology ROS (+)   Anesthesia Other Findings   Reproductive/Obstetrics                             Anesthesia Physical Anesthesia Plan  ASA: 2  Anesthesia Plan: General   Post-op Pain Management: Tylenol PO (pre-op)*   Induction: Intravenous  PONV Risk Score and Plan: 3 and Ondansetron, Dexamethasone and Treatment may vary due to age or medical condition  Airway Management Planned: LMA  Additional Equipment:   Intra-op Plan:   Post-operative Plan: Extubation in OR  Informed Consent: I have reviewed the patients History and Physical, chart, labs and discussed the procedure including the risks, benefits and alternatives for the proposed anesthesia with the patient or authorized representative who has indicated his/her understanding and acceptance.     Dental advisory given  Plan Discussed with: CRNA and Anesthesiologist  Anesthesia Plan Comments: (Risks of general  anesthesia discussed including, but not limited to, sore throat, hoarse voice, chipped/damaged teeth, injury to vocal cords, nausea and vomiting, allergic reactions, lung infection, heart attack, stroke, and death. All questions answered. )        Anesthesia Quick Evaluation

## 2022-11-07 NOTE — Interval H&P Note (Signed)
History and Physical Interval Note:  11/07/2022 2:01 PM  Karen Kramer  has presented today for surgery, with the diagnosis of stress urinary incontinence.  The various methods of treatment have been discussed with the patient and family. After consideration of risks, benefits and other options for treatment, the patient has consented to  Procedure(s) with comments: TRANSVAGINAL TAPE (TVT) PROCEDURE (N/A) CYSTOSCOPY (N/A) as a surgical intervention.  The patient's history has been reviewed, patient examined, no change in status, stable for surgery.  I have reviewed the patient's chart and labs.  Questions were answered to the patient's satisfaction.     Jaquita Folds

## 2022-11-07 NOTE — Anesthesia Postprocedure Evaluation (Signed)
Anesthesia Post Note  Patient: Renecia Shrode  Procedure(s) Performed: TRANSVAGINAL TAPE (TVT) PROCEDURE (Vagina ) CYSTOSCOPY (Bladder)     Patient location during evaluation: PACU Anesthesia Type: General Level of consciousness: awake Pain management: pain level controlled Vital Signs Assessment: post-procedure vital signs reviewed and stable Respiratory status: spontaneous breathing, nonlabored ventilation and respiratory function stable Cardiovascular status: blood pressure returned to baseline and stable Postop Assessment: no apparent nausea or vomiting Anesthetic complications: no   No notable events documented.  Last Vitals:  Vitals:   11/07/22 1237 11/07/22 1505  BP: 134/84   Pulse: (!) 16 84  Resp: 16 18  Temp: 36.6 C 36.7 C  SpO2: 100% 100%    Last Pain:  Vitals:   11/07/22 1505  TempSrc:   PainSc: 0-No pain                 Nilda Simmer

## 2022-11-07 NOTE — Anesthesia Procedure Notes (Signed)
Procedure Name: LMA Insertion Date/Time: 11/07/2022 2:09 PM  Performed by: Suan Halter, CRNAPre-anesthesia Checklist: Patient identified, Emergency Drugs available, Suction available and Patient being monitored Patient Re-evaluated:Patient Re-evaluated prior to induction Oxygen Delivery Method: Circle system utilized Preoxygenation: Pre-oxygenation with 100% oxygen Induction Type: IV induction Ventilation: Mask ventilation without difficulty LMA: LMA inserted LMA Size: 4.0 Number of attempts: 1 Airway Equipment and Method: Bite block Placement Confirmation: positive ETCO2 Tube secured with: Tape Dental Injury: Teeth and Oropharynx as per pre-operative assessment

## 2022-11-07 NOTE — Progress Notes (Signed)
Voiding Trial complete.  Instilled 300 ml 0.9 NS Voided 300 ml orange clear urine Bladder Scan 0 ml    Dr. Wannetta Sender notified via telephone call, no new orders at this time.

## 2022-11-08 ENCOUNTER — Telehealth: Payer: Self-pay | Admitting: Obstetrics and Gynecology

## 2022-11-08 NOTE — Telephone Encounter (Signed)
Karen Kramer underwent midurethral sling, cystoscopy on 11/07/22.   She passed her voiding trial.  347m was backfilled into the bladder Voided 3035m PVR by bladder scan was 70m37m  She was discharged without a catheter. Please call her for a routine post op check. Thanks!  MicJaquita FoldsD

## 2022-11-11 ENCOUNTER — Encounter: Payer: Self-pay | Admitting: *Deleted

## 2022-11-11 ENCOUNTER — Encounter (HOSPITAL_BASED_OUTPATIENT_CLINIC_OR_DEPARTMENT_OTHER): Payer: Self-pay | Admitting: Obstetrics and Gynecology

## 2022-11-11 NOTE — Telephone Encounter (Signed)
Pain: Reports tenderness on the left side (2/10 pain) has been taking the 622m ibuprofen, has only had two of the Oxycodone  Urinate: Reports she has been able to urinate, reports no leakage, but does feel a pressure.  Bowel Movement: Patient reports a bowel movement on Saturday. Is taking Miralax.   Overall she feels she is doing well, besides her pressure when she urinates. Denies any UTI symptoms.  Patient aware to call with any symptoms of UTI or other post op concerns.

## 2022-11-17 ENCOUNTER — Other Ambulatory Visit: Payer: Self-pay | Admitting: Dermatology

## 2022-11-17 DIAGNOSIS — L219 Seborrheic dermatitis, unspecified: Secondary | ICD-10-CM

## 2022-11-20 ENCOUNTER — Ambulatory Visit (INDEPENDENT_AMBULATORY_CARE_PROVIDER_SITE_OTHER): Payer: Medicaid Other | Admitting: Dermatology

## 2022-11-20 ENCOUNTER — Encounter: Payer: Self-pay | Admitting: Dermatology

## 2022-11-20 VITALS — BP 144/93 | HR 81

## 2022-11-20 DIAGNOSIS — L219 Seborrheic dermatitis, unspecified: Secondary | ICD-10-CM

## 2022-11-20 DIAGNOSIS — D229 Melanocytic nevi, unspecified: Secondary | ICD-10-CM

## 2022-11-20 DIAGNOSIS — L578 Other skin changes due to chronic exposure to nonionizing radiation: Secondary | ICD-10-CM | POA: Diagnosis not present

## 2022-11-20 DIAGNOSIS — L821 Other seborrheic keratosis: Secondary | ICD-10-CM

## 2022-11-20 DIAGNOSIS — Z1283 Encounter for screening for malignant neoplasm of skin: Secondary | ICD-10-CM

## 2022-11-20 DIAGNOSIS — L814 Other melanin hyperpigmentation: Secondary | ICD-10-CM

## 2022-11-20 MED ORDER — KETOCONAZOLE 2 % EX SHAM: MEDICATED_SHAMPOO | CUTANEOUS | 11 refills | Status: DC

## 2022-11-20 NOTE — Progress Notes (Signed)
   Follow-Up Visit   Subjective  Karen Kramer is a 42 y.o. female who presents for the following: Annual Exam (The patient presents for Total-Body Skin Exam (TBSE) for skin cancer screening and mole check.  The patient has spots, moles and lesions to be evaluated, some may be new or changing and the patient has concerns that these could be cancer.No personal hx skin cancer or atypia. /) and Seborrheic Dermatitis (Hx of seb derm at scalp. Uses ketoconazole 2% shampoo as needed. ).  Patient with a new spot she noticed last week at mid chest, no symptoms with it.   Family history of skin cancer - what type(s): unknown - who affected: grandfather   The following portions of the chart were reviewed this encounter and updated as appropriate:   Tobacco  Allergies  Meds  Problems  Med Hx  Surg Hx  Fam Hx      Review of Systems:  No other skin or systemic complaints except as noted in HPI or Assessment and Plan.  Objective  Well appearing patient in no apparent distress; mood and affect are within normal limits.  A full examination was performed including scalp, head, eyes, ears, nose, lips, neck, chest, axillae, abdomen, back, buttocks, bilateral upper extremities, bilateral lower extremities, hands, feet, fingers, toes, fingernails, and toenails. All findings within normal limits unless otherwise noted below.  Scalp Mild scale     Assessment & Plan  Seborrheic dermatitis Scalp  Chronic condition with duration or expected duration over one year. Currently well-controlled.  Continue ketoconazole 2% shampoo apply three times per week, massage into scalp and leave in for 10 minutes before rinsing out    Related Medications ketoconazole (NIZORAL) 2 % shampoo apply three times per week, massage into scalp and leave in for 10 minutes before rinsing out   Lentigines - Scattered tan macules - Due to sun exposure - Benign-appearing, observe - Recommend daily broad spectrum  sunscreen SPF 30+ to sun-exposed areas, reapply every 2 hours as needed. - Call for any changes  Seborrheic Keratoses - Stuck-on, waxy, tan-brown papules and/or plaques  - Benign-appearing - Discussed benign etiology and prognosis. - Observe - Call for any changes  Melanocytic Nevi - Tan-brown and/or pink-flesh-colored symmetric macules and papules - Benign appearing on exam today - Observation - Call clinic for new or changing moles - Recommend daily use of broad spectrum spf 30+ sunscreen to sun-exposed areas.   Hemangiomas - Red papules - Discussed benign nature - Observe - Call for any changes  Actinic Damage - Chronic condition, secondary to cumulative UV/sun exposure - diffuse scaly erythematous macules with underlying dyspigmentation - Recommend daily broad spectrum sunscreen SPF 30+ to sun-exposed areas, reapply every 2 hours as needed.  - Staying in the shade or wearing long sleeves, sun glasses (UVA+UVB protection) and wide brim hats (4-inch brim around the entire circumference of the hat) are also recommended for sun protection.  - Call for new or changing lesions.  Skin cancer screening performed today.  Return in about 1 year (around 11/21/2023) for TBSE.  Graciella Belton, RMA, am acting as scribe for Forest Gleason, MD .  Documentation: I have reviewed the above documentation for accuracy and completeness, and I agree with the above.  Forest Gleason, MD

## 2022-11-20 NOTE — Patient Instructions (Signed)
Recommend taking Heliocare sun protection supplement daily in sunny weather for additional sun protection. For maximum protection on the sunniest days, you can take up to 2 capsules of regular Heliocare OR take 1 capsule of Heliocare Ultra. For prolonged exposure (such as a full day in the sun), you can repeat your dose of the supplement 4 hours after your first dose. Heliocare can be purchased at  [facility], at some [facility] or at www.heliocare.com.    Melanoma ABCDEs  Melanoma is the most dangerous type of skin cancer, and is the leading cause of death from skin disease.  You are more likely to develop melanoma if you: Have light-colored skin, light-colored eyes, or red or blond hair Spend a lot of time in the sun Tan regularly, either outdoors or in a tanning bed Have had blistering sunburns, especially during childhood Have a close family member who has had a melanoma Have atypical moles or large birthmarks  Early detection of melanoma is key since treatment is typically straightforward and cure rates are extremely high if we catch it early.   The first sign of melanoma is often a change in a mole or a new dark spot.  The ABCDE system is a way of remembering the signs of melanoma.  A for asymmetry:  The two halves do not match. B for border:  The edges of the growth are irregular. C for color:  A mixture of colors are present instead of an even brown color. D for diameter:  Melanomas are usually (but not always) greater than 6mm - the size of a pencil eraser. E for evolution:  The spot keeps changing in size, shape, and color.  Please check your skin once per month between visits. You can use a small mirror in front and a large mirror behind you to keep an eye on the back side or your body.   If you see any new or changing lesions before your next follow-up, please call to schedule a visit.  Please continue daily skin protection including broad spectrum sunscreen SPF 30+ to  sun-exposed areas, reapplying every 2 hours as needed when you're outdoors.    Due to recent changes in healthcare laws, you may see results of your pathology and/or laboratory studies on MyChart before the doctors have had a chance to review them. We understand that in some cases there may be results that are confusing or concerning to you. Please understand that not all results are received at the same time and often the doctors may need to interpret multiple results in order to provide you with the best plan of care or course of treatment. Therefore, we ask that you please give us 2 business days to thoroughly review all your results before contacting the office for clarification. Should we see a critical lab result, you will be contacted sooner.   If You Need Anything After Your Visit  If you have any questions or concerns for your doctor, please call our main line at [phone] and press option 4 to reach your doctor's medical assistant. If no one answers, please leave a voicemail as directed and we will return your call as soon as possible. Messages left after 4 pm will be answered the following business day.   You may also send us a message via MyChart. We typically respond to MyChart messages within 1-2 business days.  For prescription refills, please ask your pharmacy to contact our office. Our fax number is [phone].  If you have   an urgent issue when the clinic is closed that cannot wait until the next business day, you can page your doctor at the number below.    Please note that while we do our best to be available for urgent issues outside of office hours, we are not available 24/7.   If you have an urgent issue and are unable to reach us, you may choose to seek medical care at your doctor's office, retail clinic, urgent care center, or emergency room.  If you have a medical emergency, please immediately call 911 or go to the emergency department.  Pager Numbers  - Dr.  Kowalski: [phone]  - Dr. Moye: [phone]  - Dr. Stewart: [phone]  In the event of inclement weather, please call our main line at [phone] for an update on the status of any delays or closures.  Dermatology Medication Tips: Please keep the boxes that topical medications come in in order to help keep track of the instructions about where and how to use these. Pharmacies typically print the medication instructions only on the boxes and not directly on the medication tubes.   If your medication is too expensive, please contact our office at [phone] option 4 or send us a message through MyChart.   We are unable to tell what your co-pay for medications will be in advance as this is different depending on your insurance coverage. However, we may be able to find a substitute medication at lower cost or fill out paperwork to get insurance to cover a needed medication.   If a prior authorization is required to get your medication covered by your insurance company, please allow us 1-2 business days to complete this process.  Drug prices often vary depending on where the prescription is filled and some pharmacies may offer cheaper prices.  The website www.goodrx.com contains coupons for medications through different pharmacies. The prices here do not account for what the cost may be with help from insurance (it may be cheaper with your insurance), but the website can give you the price if you did not use any insurance.  - You can print the associated coupon and take it with your prescription to the pharmacy.  - You may also stop by our office during regular business hours and pick up a GoodRx coupon card.  - If you need your prescription sent electronically to a different pharmacy, notify our office through [facility] MyChart or by phone at [phone] option 4.     Si Usted Necesita Algo Despus de Su Visita  Tambin puede enviarnos un mensaje a travs de MyChart. Por lo  general respondemos a los mensajes de MyChart en el transcurso de 1 a 2 das hbiles.  Para renovar recetas, por favor pida a su farmacia que se ponga en contacto con nuestra oficina. Nuestro nmero de fax es el [phone].  Si tiene un asunto urgente cuando la clnica est cerrada y que no puede esperar hasta el siguiente da hbil, puede llamar/localizar a su doctor(a) al nmero que aparece a continuacin.   Por favor, tenga en cuenta que aunque hacemos todo lo posible para estar disponibles para asuntos urgentes fuera del horario de oficina, no estamos disponibles las 24 horas del da, los 7 das de la semana.   Si tiene un problema urgente y no puede comunicarse con nosotros, puede optar por buscar atencin mdica  en el consultorio de su doctor(a), en una clnica privada, en un centro de atencin urgente o en una sala de   emergencias.  Si tiene una emergencia mdica, por favor llame inmediatamente al 911 o vaya a la sala de emergencias.  Nmeros de bper  - Dr. Kowalski: [phone]  - Dra. Moye: [phone]  - Dra. Stewart: [phone]  En caso de inclemencias del tiempo, por favor llame a nuestra lnea principal al [phone] para una actualizacin sobre el estado de cualquier retraso o cierre.  Consejos para la medicacin en dermatologa: Por favor, guarde las cajas en las que vienen los medicamentos de uso tpico para ayudarle a seguir las instrucciones sobre dnde y cmo usarlos. Las farmacias generalmente imprimen las instrucciones del medicamento slo en las cajas y no directamente en los tubos del medicamento.   Si su medicamento es muy caro, por favor, pngase en contacto con nuestra oficina llamando al [phone] y presione la opcin 4 o envenos un mensaje a travs de MyChart.   No podemos decirle cul ser su copago por los medicamentos por adelantado ya que esto es diferente dependiendo de la cobertura de su seguro. Sin embargo, es posible que podamos encontrar un  medicamento sustituto a menor costo o llenar un formulario para que el seguro cubra el medicamento que se considera necesario.   Si se requiere una autorizacin previa para que su compaa de seguros cubra su medicamento, por favor permtanos de 1 a 2 das hbiles para completar este proceso.  Los precios de los medicamentos varan con frecuencia dependiendo del lugar de dnde se surte la receta y alguna farmacias pueden ofrecer precios ms baratos.  El sitio web www.goodrx.com tiene cupones para medicamentos de diferentes farmacias. Los precios aqu no tienen en cuenta lo que podra costar con la ayuda del seguro (puede ser ms barato con su seguro), pero el sitio web puede darle el precio si no utiliz ningn seguro.  - Puede imprimir el cupn correspondiente y llevarlo con su receta a la farmacia.  - Tambin puede pasar por nuestra oficina durante el horario de atencin regular y recoger una tarjeta de cupones de GoodRx.  - Si necesita que su receta se enve electrnicamente a una farmacia diferente, informe a nuestra oficina a travs de MyChart de [facility] o por telfono llamando al [phone] y presione la opcin 4.  

## 2022-11-22 ENCOUNTER — Encounter: Payer: Self-pay | Admitting: Obstetrics and Gynecology

## 2022-12-20 ENCOUNTER — Encounter: Payer: Medicaid Other | Admitting: Obstetrics and Gynecology

## 2022-12-25 ENCOUNTER — Encounter: Payer: Self-pay | Admitting: Obstetrics and Gynecology

## 2022-12-25 ENCOUNTER — Ambulatory Visit (INDEPENDENT_AMBULATORY_CARE_PROVIDER_SITE_OTHER): Payer: Medicaid Other | Admitting: Obstetrics and Gynecology

## 2022-12-25 VITALS — BP 138/91 | HR 73

## 2022-12-25 DIAGNOSIS — Z9889 Other specified postprocedural states: Secondary | ICD-10-CM

## 2022-12-25 NOTE — Progress Notes (Signed)
[facility]  Date of Visit: 12/25/2022  History of Present Illness: Ms. Karen Kramer is a 42 y.o. female scheduled today for a post-operative visit.   Surgery: s/p Midurethral sling (advantge fit), cystoscopy on 11/07/22  She passed her postoperative void trial.   Postoperative course has been uncomplicated.   Today she reports she feels like she is urinating through a strainer but she is not straining to void. She has not had any leakage at all. Pt voided and PVR today with bladder scanner was 0 ml.  UTI in the last 6 weeks? No  Pain? No  She has not returned to her normal activity (except for postop restrictions) Vaginal bulge? No  Stress incontinence: No  Urgency/frequency:  maybe two instances of urgency, but no leakage.  Urge incontinence: No  Voiding dysfunction: No  Bowel issues: No    Medications: She has a current medication list which includes the following prescription(s): acetaminophen, azelastine, fluticasone, ibuprofen, ketoconazole, levocetirizine, omeprazole, propranolol, and topiramate.   Allergies: Patient is allergic to latex.   Physical Exam: BP (!) 138/91   Pulse 73   LMP 05/25/2011    Suprapubic Incisions: healing well.  Pelvic Examination: Vagina: On speculum, incisions well healed. No sutures are present. No tenderness.  No visible or palpable mesh.   ---------------------------------------------------------  Assessment and Plan:  1. Post-operative state     - Well healed, happy with results of the surgery.  - Can resume regular activity including tub baths, exercise and intercourse,  if desired.   Return as needed  Jaquita Folds, MD

## 2023-05-15 ENCOUNTER — Other Ambulatory Visit: Payer: Self-pay | Admitting: Family Medicine

## 2023-05-15 DIAGNOSIS — Z1231 Encounter for screening mammogram for malignant neoplasm of breast: Secondary | ICD-10-CM

## 2023-05-29 ENCOUNTER — Ambulatory Visit
Admission: RE | Admit: 2023-05-29 | Discharge: 2023-05-29 | Disposition: A | Discharge status: Home / Self Care | Payer: Medicaid Other | Source: Ambulatory Visit | Attending: Family Medicine | Admitting: Family Medicine

## 2023-05-29 DIAGNOSIS — Z1231 Encounter for screening mammogram for malignant neoplasm of breast: Secondary | ICD-10-CM | POA: Diagnosis present

## 2023-06-06 ENCOUNTER — Other Ambulatory Visit: Payer: Medicaid Other

## 2023-06-06 ENCOUNTER — Other Ambulatory Visit: Payer: Self-pay | Admitting: Family Medicine

## 2023-06-06 DIAGNOSIS — N63 Unspecified lump in unspecified breast: Secondary | ICD-10-CM

## 2023-06-06 DIAGNOSIS — R928 Other abnormal and inconclusive findings on diagnostic imaging of breast: Secondary | ICD-10-CM

## 2023-06-10 ENCOUNTER — Ambulatory Visit
Admission: RE | Admit: 2023-06-10 | Discharge: 2023-06-10 | Disposition: A | Discharge status: Home / Self Care | Payer: Medicaid Other | Source: Ambulatory Visit | Attending: Family Medicine | Admitting: Family Medicine

## 2023-06-10 ENCOUNTER — Ambulatory Visit
Admission: RE | Admit: 2023-06-10 | Discharge: 2023-06-10 | Disposition: A | Discharge status: Home / Self Care | Payer: Medicaid Other | Source: Ambulatory Visit | Attending: Family Medicine

## 2023-06-10 DIAGNOSIS — R928 Other abnormal and inconclusive findings on diagnostic imaging of breast: Secondary | ICD-10-CM | POA: Diagnosis present

## 2023-06-10 DIAGNOSIS — N63 Unspecified lump in unspecified breast: Secondary | ICD-10-CM | POA: Diagnosis present

## 2023-10-03 ENCOUNTER — Ambulatory Visit: Payer: Medicaid Other | Admitting: Obstetrics and Gynecology

## 2023-10-06 ENCOUNTER — Inpatient Hospital Stay: Payer: Medicaid Other | Attending: Oncology | Admitting: Oncology

## 2023-10-06 ENCOUNTER — Encounter: Payer: Self-pay | Admitting: Oncology

## 2023-10-06 ENCOUNTER — Inpatient Hospital Stay: Payer: Medicaid Other

## 2023-10-06 VITALS — BP 113/63 | HR 74 | Temp 96.8°F | Resp 19 | Ht 63.0 in | Wt 149.3 lb

## 2023-10-06 DIAGNOSIS — Z801 Family history of malignant neoplasm of trachea, bronchus and lung: Secondary | ICD-10-CM | POA: Diagnosis not present

## 2023-10-06 DIAGNOSIS — Z803 Family history of malignant neoplasm of breast: Secondary | ICD-10-CM | POA: Insufficient documentation

## 2023-10-06 DIAGNOSIS — Z800 Family history of malignant neoplasm of digestive organs: Secondary | ICD-10-CM | POA: Insufficient documentation

## 2023-10-06 DIAGNOSIS — T148XXA Other injury of unspecified body region, initial encounter: Secondary | ICD-10-CM

## 2023-10-06 DIAGNOSIS — R233 Spontaneous ecchymoses: Secondary | ICD-10-CM | POA: Insufficient documentation

## 2023-10-06 DIAGNOSIS — Z79899 Other long term (current) drug therapy: Secondary | ICD-10-CM | POA: Insufficient documentation

## 2023-10-06 DIAGNOSIS — Z8042 Family history of malignant neoplasm of prostate: Secondary | ICD-10-CM | POA: Diagnosis not present

## 2023-10-06 DIAGNOSIS — Z806 Family history of leukemia: Secondary | ICD-10-CM | POA: Diagnosis not present

## 2023-10-06 LAB — CBC WITH DIFFERENTIAL/PLATELET
Abs Immature Granulocytes: 0.02 10*3/uL (ref 0.00–0.07)
Basophils Absolute: 0 10*3/uL (ref 0.0–0.1)
Basophils Relative: 0 %
Eosinophils Absolute: 0.1 10*3/uL (ref 0.0–0.5)
Eosinophils Relative: 1 %
HCT: 44.1 % (ref 36.0–46.0)
Hemoglobin: 15.2 g/dL — ABNORMAL HIGH (ref 12.0–15.0)
Immature Granulocytes: 0 %
Lymphocytes Relative: 31 %
Lymphs Abs: 2.4 10*3/uL (ref 0.7–4.0)
MCH: 30.8 pg (ref 26.0–34.0)
MCHC: 34.5 g/dL (ref 30.0–36.0)
MCV: 89.5 fL (ref 80.0–100.0)
Monocytes Absolute: 0.4 10*3/uL (ref 0.1–1.0)
Monocytes Relative: 5 %
Neutro Abs: 4.9 10*3/uL (ref 1.7–7.7)
Neutrophils Relative %: 63 %
Platelets: 273 10*3/uL (ref 150–400)
RBC: 4.93 MIL/uL (ref 3.87–5.11)
RDW: 12.1 % (ref 11.5–15.5)
WBC: 7.9 10*3/uL (ref 4.0–10.5)
nRBC: 0 % (ref 0.0–0.2)

## 2023-10-06 LAB — PROTIME-INR
INR: 0.9 (ref 0.8–1.2)
Prothrombin Time: 12.3 s (ref 11.4–15.2)

## 2023-10-06 LAB — APTT: aPTT: 25 s (ref 24–36)

## 2023-10-08 LAB — COAG STUDIES INTERP REPORT

## 2023-10-08 LAB — VON WILLEBRAND PANEL
Coagulation Factor VIII: 109 % (ref 56–140)
Ristocetin Co-factor, Plasma: 75 % (ref 50–200)
Von Willebrand Antigen, Plasma: 125 % (ref 50–200)

## 2023-10-10 NOTE — Progress Notes (Addendum)
Hematology/Oncology Consult note [facility] [phone] [phone]) [phone]  Patient Care Team: Loura Pardon, NP as PCP - General (Family Medicine)   Name of the patient: Karen Kramer  [id number]  [birth year]    Reason for referral-low von Willebrand factor and easy bruising   Referring physician-Amy Krebs, NP  Date of visit: 10/10/23   History of presenting illness-patient is a 43 year old female with a past medical history significant for asthma migraine anxiety who has been referred for easy bruising and low von Willebrand factor.  Patient reports having random episodes of bruises especially over her bilateral thighs and arms without any apparent trauma.  These come and go and have been more prominent over the last couple of months.  Denies any gum bleeds or nosebleeds.she has had prior hysterectomy and wisdom tooth extractions and did not have any significant bleeding following the procedure.  Patient had hysterectomy back in 2012 and following that she had significant bruising noted on her inner aspect of thighs and patient was worked up for bleeding disorder in August 13, 2011 by Dr. Marcheta Grammes.  At that time she was noted to have normal PT PTT INR thrombin time platelet function testing showed slightly decreased secretion with higher ADP concentration but otherwise normal aggregation and secretion response to all agonists.  Coagulation factor levels were normal to elevated.  No overt etiology for bleeding disorder was found back then.  Patient underwent von Willebrand panel testing through her primary care provider and was noted to have a von Willebrand antigen level of 48% and therefore report was  ECOG PS- 0  Pain scale- 0   Review of systems- Review of Systems  Constitutional:  Negative for chills, fever, malaise/fatigue and weight loss.  HENT:  Negative for congestion, ear discharge and nosebleeds.   Eyes:  Negative for blurred vision.   Respiratory:  Negative for cough, hemoptysis, sputum production, shortness of breath and wheezing.   Cardiovascular:  Negative for chest pain, palpitations, orthopnea and claudication.  Gastrointestinal:  Negative for abdominal pain, blood in stool, constipation, diarrhea, heartburn, melena, nausea and vomiting.  Genitourinary:  Negative for dysuria, flank pain, frequency, hematuria and urgency.  Musculoskeletal:  Negative for back pain, joint pain and myalgias.  Skin:  Negative for rash.  Neurological:  Negative for dizziness, tingling, focal weakness, seizures, weakness and headaches.  Endo/Heme/Allergies:  Bruises/bleeds easily.  Psychiatric/Behavioral:  Negative for depression and suicidal ideas. The patient does not have insomnia.     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/13/0865   Family history of gene mutation 04/16/2021   Family history of pancreatic cancer 04/16/2021   Family history of leukemia 04/16/2021   Family history of lung cancer 04/16/2021   Family history of breast cancer 04/16/2021   Family history of prostate cancer 04/16/2021   Seasonal allergic rhinitis due to pollen 06/27/2017   Vitamin D deficiency 02/20/2016   Overweight 02/20/2016   Gastroesophageal reflux disease without esophagitis 01/25/2016   Mixed incontinence urge and stress 08/24/2015   Anxiety and depression 05/12/2015   Insomnia 05/12/2015   Infection of urinary tract 11/20/2012   Edema 06/11/2012   Complicated UTI (urinary tract infection) 03/31/2012   Left knee pain 11/22/2011   Syncope 05/28/2011   LOW BACK PAIN, CHRONIC 05/17/2009   Disorder of sacrum 05/17/2009   TROCHANTERIC BURSITIS, RIGHT 05/17/2009  Other specified enthesopathies of unspecified lower limb, excluding foot 05/17/2009   Sacrococcygeal disorders, not elsewhere classified 05/17/2009    FATIGUE 03/23/2009   Migraine without aura 04/15/2008   Asthma 04/15/2008   Uncomplicated asthma 04/15/2008   Anxiety 11/22/1991     Past Medical History:  Diagnosis Date   Anxiety    Asthma    Atypical cervical glandular cells    Depression    Family history of breast cancer    Family history of gene mutation    Family history of leukemia    Family history of lung cancer    Family history of pancreatic cancer    Family history of prostate cancer    GERD (gastroesophageal reflux disease)      Past Surgical History:  Procedure Laterality Date   ABDOMINAL HYSTERECTOMY     for heavy periods and abnormal paps   BLADDER SUSPENSION N/A 11/07/2022   Procedure: TRANSVAGINAL TAPE (TVT) PROCEDURE;  Surgeon: Marguerita Beards, MD;  Location: [facility];  Service: Gynecology;  Laterality: N/A;  Total time requested is 1 hour   CYSTOSCOPY N/A 11/07/2022   Procedure: CYSTOSCOPY;  Surgeon: Marguerita Beards, MD;  Location: [facility];  Service: Gynecology;  Laterality: N/A;   TUBAL LIGATION  09/23/2006    Social History   Socioeconomic History   Marital status: Divorced    Spouse name: Not on file   Number of children: 2   Years of education: Not on file   Highest education level: Not on file  Occupational History   Occupation: homemaker  Tobacco Use   Smoking status: Never   Smokeless tobacco: Not on file  Vaping Use   Vaping status: Never Used  Substance and Sexual Activity   Alcohol use: Not Currently   Drug use: No   Sexual activity: Yes    Birth control/protection: Surgical  Other Topics Concern   Not on file  Social History Narrative   Regular exercise-yes 2-3 times a week, walk/treadmill   Social Drivers of Health   Financial Resource Strain: Low Risk  (02/15/2023)   Received from [facility]   Overall Financial Resource Strain (CARDIA)    Difficulty of Paying Living  Expenses: Not very hard  Food Insecurity: No Food Insecurity (10/06/2023)   Hunger Vital Sign    Worried About Running Out of Food in the Last Year: Never true    Ran Out of Food in the Last Year: Never true  Transportation Needs: No Transportation Needs (10/06/2023)   PRAPARE - Administrator, Civil Service (Medical): No    Lack of Transportation (Non-Medical): No  Physical Activity: Not on file  Stress: Not on file  Social Connections: Not on file  Intimate Partner Violence: Not At Risk (10/06/2023)   Humiliation, Afraid, Rape, and Kick questionnaire    Fear of Current or Ex-Partner: No    Emotionally Abused: No    Physically Abused: No    Sexually Abused: No     Family History  Problem Relation Age of Onset   Pancreatic cancer Mother 68   Lung disease Father    Seizures Father 59   Lung cancer Father 52       mets to brain, hx smoking   Other Half-Sister        [facility] gene mutation carrier   Leukemia Maternal Grandmother 9       acute   Prostate cancer Maternal Grandfather 71  metastatic   Lung cancer Maternal Grandfather 53   Cancer Paternal Grandfather        prostate   Breast cancer Other 34       MGM's sister   Prostate cancer Maternal Great-grandfather 48       MGM's father     Current Outpatient Medications:    estradiol (VIVELLE-DOT) 0.025 MG/24HR, Place onto the skin., Disp: , Rfl:    acetaminophen (TYLENOL) 500 MG tablet, Take 1 tablet (500 mg total) by mouth every 6 (six) hours as needed (pain)., Disp: 30 tablet, Rfl: 0   azelastine (ASTELIN) 0.1 % nasal spray, Place 2 sprays into both nostrils daily., Disp: , Rfl: 12   fluticasone (FLONASE) 50 MCG/ACT nasal spray, 2 (TWO) SPRAY, NASAL, TWO TIMES DAILY, Disp: , Rfl: 7   ibuprofen (ADVIL) 600 MG tablet, Take 1 tablet (600 mg total) by mouth every 6 (six) hours as needed. (Patient not taking: Reported on 10/06/2023), Disp: 30 tablet, Rfl: 0   ketoconazole (NIZORAL) 2 % shampoo, apply three times  per week, massage into scalp and leave in for 10 minutes before rinsing out, Disp: 120 mL, Rfl: 11   levocetirizine (XYZAL) 5 MG tablet, Take 5 mg by mouth at bedtime., Disp: , Rfl:    naproxen (NAPROSYN) 500 MG tablet, Take 500 mg by mouth 2 (two) times daily., Disp: , Rfl:    omeprazole (PRILOSEC) 20 MG capsule, Take 1 capsule (20 mg total) by mouth 2 (two) times daily before a meal., Disp: 180 capsule, Rfl: 0   ondansetron (ZOFRAN-ODT) 4 MG disintegrating tablet, Take 4 mg by mouth every 8 (eight) hours as needed., Disp: , Rfl:    propranolol (INDERAL) 10 MG tablet, Take 10 mg by mouth daily as needed (Anxiety)., Disp: , Rfl:    topiramate (TOPAMAX) 25 MG tablet, Take 75 mg by mouth at bedtime. (Patient not taking: Reported on 10/06/2023), Disp: , Rfl:    Physical exam:  Vitals:   10/06/23 1114  BP: 113/63  Pulse: 74  Resp: 19  Temp: (!) 96.8 F (36 C)  TempSrc: Tympanic  SpO2: 100%  Weight: 149 lb 4.8 oz (67.7 kg)  Height: 5\' 3"  (1.6 m)   Physical Exam Cardiovascular:     Rate and Rhythm: Normal rate and regular rhythm.     Heart sounds: Normal heart sounds.  Pulmonary:     Effort: Pulmonary effort is normal.     Breath sounds: Normal breath sounds.  Abdominal:     General: Bowel sounds are normal.     Palpations: Abdomen is soft.  Skin:    Comments: No overt bruising noted on today's exam.  Stigmata of discoloration from prior episodes of bruising noted over right knee  Neurological:     Mental Status: She is alert and oriented to person, place, and time.           Latest Ref Rng & Units 03/06/2016    5:22 PM  CMP  Glucose 65 - 99 mg/dL 76   BUN 6 - 20 mg/dL 20   Creatinine 1.61 - 1.00 mg/dL 0.96   Sodium 045 - 409 mmol/L 142   Potassium 3.5 - 5.1 mmol/L 3.8   Chloride 101 - 111 mmol/L 109   CO2 22 - 32 mmol/L 25   Calcium 8.9 - 10.3 mg/dL 9.2   Total Protein 6.5 - 8.1 g/dL 7.2   Total Bilirubin 0.3 - 1.2 mg/dL 0.5   Alkaline Phos 38 - 126 U/L 61  AST 15 -  41 U/L 17   ALT 14 - 54 U/L 12       Latest Ref Rng & Units 10/06/2023   12:19 PM  CBC  WBC 4.0 - 10.5 K/uL 7.9   Hemoglobin 12.0 - 15.0 g/dL 42.5   Hematocrit 95.6 - 46.0 % 44.1   Platelets 150 - 400 K/uL 273     Assessment and plan- Patient is a 43 y.o. female referred for easy bruising  Back in 2012 patient had an extensive workup for bleeding disorder at [facility] including platelet aggregation studies, PT PTT INR thrombin time von Willebrand panel and clotting factors which were all normal.  Therefore patient does not have any inherited bleeding disorder.  On today's exam patient does not have any large bruises to show but she does  have stigmata of discoloration from prior bruising.  I am checking CBC with differential PT PTT INR and repeat von Willebrand panel at this time.  If these tests are normal I am inclined to monitor bruising conservatively.  If there is any concern for worsening bleeding or bruising I will refer her to [facility] for further management since we are unable to perform platelet aggregation and electron microscopy studies here.   Thank you for this kind referral and the opportunity to participate in the care of this patient   Visit Diagnosis 1. Bruising     Dr. Owens Shark, MD, MPH [facility] at [facility] [phone] 10/10/2023

## 2023-10-21 ENCOUNTER — Ambulatory Visit: Payer: Medicaid Other | Admitting: Obstetrics and Gynecology

## 2023-10-28 ENCOUNTER — Inpatient Hospital Stay: Payer: Medicaid Other | Attending: Oncology | Admitting: Oncology

## 2023-10-28 DIAGNOSIS — Z800 Family history of malignant neoplasm of digestive organs: Secondary | ICD-10-CM

## 2023-10-28 DIAGNOSIS — T148XXA Other injury of unspecified body region, initial encounter: Secondary | ICD-10-CM

## 2023-10-28 DIAGNOSIS — R233 Spontaneous ecchymoses: Secondary | ICD-10-CM

## 2023-10-28 NOTE — Progress Notes (Signed)
 I connected with Karen Kramer on 10/28/23 at  3:15 PM EST by video enabled telemedicine visit and verified that I am speaking with the correct person using two identifiers.   I discussed the limitations, risks, security and privacy concerns of performing an evaluation and management service by telemedicine and the availability of in-person appointments. I also discussed with the patient that there may be a patient responsible charge related to this service. The patient expressed understanding and agreed to proceed.  Other persons participating in the visit and their role in the encounter:  none  Patient's location:  car Provider's location:  work  Stage Manager Complaint: Discuss results of blood work  Diagnosis easy bruising of unclear etiology  History of present illness: patient is a 43 year old female with a past medical history significant for asthma migraine anxiety who has been referred for easy bruising and low von Willebrand factor.  Patient reports having random episodes of bruises especially over her bilateral thighs and arms without any apparent trauma.  These come and go and have been more prominent over the last couple of months.  Denies any gum bleeds or nosebleeds.she has had prior hysterectomy and wisdom tooth extractions and did not have any significant bleeding following the procedure.  Patient had hysterectomy back in 2012 and following that she had significant bruising noted on her inner aspect of thighs and patient was worked up for bleeding disorder in August 13, 2011 by Dr. Honor.  At that time she was noted to have normal PT PTT INR thrombin time platelet function testing showed slightly decreased secretion with higher ADP concentration but otherwise normal aggregation and secretion response to all agonists.  Coagulation factor levels were normal to elevated.  No overt etiology for bleeding disorder was found back then.  Patient underwent von Willebrand panel testing through her  primary care provider and was noted to have a von Willebrand antigen level of 48%   Results of blood work from 10/06/2023 showed a normal CBC with a platelet count of 273.  PT PTT INR were within normal limits.  Von Willebrand panel was normal with factor VIII level of 109, ristocetin cofactor 75 and von Willebrand antigen of 125.  Interval history patient continues to report small areas of bruising around her waist as well as inner aspect of her thighs and legs without any afferent trauma.   Review of Systems  Constitutional:  Negative for chills, fever, malaise/fatigue and weight loss.  HENT:  Negative for congestion, ear discharge and nosebleeds.   Eyes:  Negative for blurred vision.  Respiratory:  Negative for cough, hemoptysis, sputum production, shortness of breath and wheezing.   Cardiovascular:  Negative for chest pain, palpitations, orthopnea and claudication.  Gastrointestinal:  Negative for abdominal pain, blood in stool, constipation, diarrhea, heartburn, melena, nausea and vomiting.  Genitourinary:  Negative for dysuria, flank pain, frequency, hematuria and urgency.  Musculoskeletal:  Negative for back pain, joint pain and myalgias.  Skin:  Negative for rash.  Neurological:  Negative for dizziness, tingling, focal weakness, seizures, weakness and headaches.  Endo/Heme/Allergies:  Bruises/bleeds easily.  Psychiatric/Behavioral:  Negative for depression and suicidal ideas. The patient does not have insomnia.     Allergies  Allergen Reactions   Latex Rash    Past Medical History:  Diagnosis Date   Anxiety    Asthma    Atypical cervical glandular cells    Depression    Family history of breast cancer    Family history of gene mutation  Family history of leukemia    Family history of lung cancer    Family history of pancreatic cancer    Family history of prostate cancer    GERD (gastroesophageal reflux disease)     Past Surgical History:  Procedure Laterality Date    ABDOMINAL HYSTERECTOMY     for heavy periods and abnormal paps   BLADDER SUSPENSION N/A 11/07/2022   Procedure: TRANSVAGINAL TAPE (TVT) PROCEDURE;  Surgeon: Marilynne Rosaline SAILOR, MD;  [facility];  Service: Gynecology;  Laterality: N/A;  Total time requested is 1 hour   CYSTOSCOPY N/A 11/07/2022   Procedure: CYSTOSCOPY;  Surgeon: Marilynne Rosaline SAILOR, MD;  Location: [facility];  Service: Gynecology;  Laterality: N/A;   TUBAL LIGATION  09/23/2006    Social History   Socioeconomic History   Marital status: Divorced    Spouse name: Not on file   Number of children: 2   Years of education: Not on file   Highest education level: Not on file  Occupational History   Occupation: homemaker  Tobacco Use   Smoking status: Never   Smokeless tobacco: Not on file  Vaping Use   Vaping status: Never Used  Substance and Sexual Activity   Alcohol use: Not Currently   Drug use: No   Sexual activity: Yes    Birth control/protection: Surgical  Other Topics Concern   Not on file  Social History Narrative   Regular exercise-yes 2-3 times a week, walk/treadmill   Social Drivers of Health   Financial Resource Strain: Low Risk  (02/15/2023)   Received from [facility]   Overall Financial Resource Strain (CARDIA)    Difficulty of Paying Living Expenses: Not very hard  Food Insecurity: No Food Insecurity (10/06/2023)   Hunger Vital Sign    Worried About Running Out of Food in the Last Year: Never true    Ran Out of Food in the Last Year: Never true  Transportation Needs: No Transportation Needs (10/06/2023)   PRAPARE - Administrator, Civil Service (Medical): No    Lack of Transportation (Non-Medical): No  Physical Activity: Not on file  Stress: Not on file  Social Connections: Not on file  Intimate Partner Violence: Not At Risk (10/06/2023)   Humiliation, Afraid, Rape, and Kick questionnaire    Fear of Current or  Ex-Partner: No    Emotionally Abused: No    Physically Abused: No    Sexually Abused: No    Family History  Problem Relation Age of Onset   Pancreatic cancer Mother 15   Lung disease Father    Seizures Father 70   Lung cancer Father 59       mets to brain, hx smoking   Other Half-Sister        [facility] gene mutation carrier   Leukemia Maternal Grandmother 74       acute   Prostate cancer Maternal Grandfather 68       metastatic   Lung cancer Maternal Grandfather 75   Cancer Paternal Grandfather        prostate   Breast cancer Other 50       MGM's sister   Prostate cancer Maternal Great-grandfather 37       MGM's father     Current Outpatient Medications:    acetaminophen  (TYLENOL ) 500 MG tablet, Take 1 tablet (500 mg total) by mouth every 6 (six) hours as needed (pain)., Disp: 30 tablet, Rfl: 0   azelastine (  ASTELIN) 0.1 % nasal spray, Place 2 sprays into both nostrils daily., Disp: , Rfl: 12   estradiol (VIVELLE-DOT) 0.025 MG/24HR, Place onto the skin., Disp: , Rfl:    fluticasone (FLONASE) 50 MCG/ACT nasal spray, 2 (TWO) SPRAY, NASAL, TWO TIMES DAILY, Disp: , Rfl: 7   ibuprofen  (ADVIL ) 600 MG tablet, Take 1 tablet (600 mg total) by mouth every 6 (six) hours as needed. (Patient not taking: Reported on 10/06/2023), Disp: 30 tablet, Rfl: 0   ketoconazole  (NIZORAL ) 2 % shampoo, apply three times per week, massage into scalp and leave in for 10 minutes before rinsing out, Disp: 120 mL, Rfl: 11   levocetirizine (XYZAL) 5 MG tablet, Take 5 mg by mouth at bedtime., Disp: , Rfl:    naproxen (NAPROSYN) 500 MG tablet, Take 500 mg by mouth 2 (two) times daily., Disp: , Rfl:    omeprazole  (PRILOSEC) 20 MG capsule, Take 1 capsule (20 mg total) by mouth 2 (two) times daily before a meal., Disp: 180 capsule, Rfl: 0   ondansetron  (ZOFRAN -ODT) 4 MG disintegrating tablet, Take 4 mg by mouth every 8 (eight) hours as needed., Disp: , Rfl:    propranolol (INDERAL) 10 MG tablet, Take 10 mg by mouth  daily as needed (Anxiety)., Disp: , Rfl:    topiramate (TOPAMAX) 25 MG tablet, Take 75 mg by mouth at bedtime. (Patient not taking: Reported on 10/06/2023), Disp: , Rfl:   No results found.  No images are attached to the encounter.      Latest Ref Rng & Units 03/06/2016    5:22 PM  CMP  Glucose 65 - 99 mg/dL 76   BUN 6 - 20 mg/dL 20   Creatinine 9.55 - 1.00 mg/dL 8.98   Sodium 864 - 854 mmol/L 142   Potassium 3.5 - 5.1 mmol/L 3.8   Chloride 101 - 111 mmol/L 109   CO2 22 - 32 mmol/L 25   Calcium 8.9 - 10.3 mg/dL 9.2   Total Protein 6.5 - 8.1 g/dL 7.2   Total Bilirubin 0.3 - 1.2 mg/dL 0.5   Alkaline Phos 38 - 126 U/L 61   AST 15 - 41 U/L 17   ALT 14 - 54 U/L 12       Latest Ref Rng & Units 10/06/2023   12:19 PM  CBC  WBC 4.0 - 10.5 K/uL 7.9   Hemoglobin 12.0 - 15.0 g/dL 84.7   Hematocrit 63.9 - 46.0 % 44.1   Platelets 150 - 400 K/uL 273      Observation/objective: Appears in no acute distress over video visit today.  Breathing is nonlabored  Assessment and plan: Patient is a 43 year old female referred for easy bruising of unclear etiology  Patient has a normal platelet count as well as PT PTT INR and von Willebrand panel.  Based on these tests there is no overt cause of bleeding disorders noted.  I discussed consideration to see benign hematology and [facility] for any additional test such as electron microscopy studies to rule out any other acquired bleeding disorder.  She has had a comprehensive workup even back in 2012 which was unremarkable.  Patient would like to hold off on that referral at this time.  I will see her back in 4 months with labs  Follow-up instructions: As above  I discussed the assessment and treatment plan with the patient. The patient was provided an opportunity to ask questions and all were answered. The patient agreed with the plan and demonstrated an understanding of the  instructions.   The patient was advised to call back or seek an in-person evaluation  if the symptoms worsen or if the condition fails to improve as anticipated.  I provided 11 minutes of face-to-face video visit time during this encounter, and > 50% was spent counseling as documented under my assessment & plan.  Visit Diagnosis: 1. Bruising   2. Family history of pancreatic cancer     Dr. Annah Skene, MD, MPH [facility] at [facility] Tel- [phone] 10/28/2023 3:40 PM

## 2023-11-19 ENCOUNTER — Encounter: Payer: Self-pay | Admitting: Obstetrics and Gynecology

## 2023-11-19 ENCOUNTER — Ambulatory Visit (INDEPENDENT_AMBULATORY_CARE_PROVIDER_SITE_OTHER): Payer: Medicaid Other | Admitting: Obstetrics and Gynecology

## 2023-11-19 VITALS — BP 128/80 | HR 84

## 2023-11-19 DIAGNOSIS — N3281 Overactive bladder: Secondary | ICD-10-CM

## 2023-11-19 NOTE — Progress Notes (Signed)
 Karen Kramer Return Visit  SUBJECTIVE  History of Present Illness: Karen Kramer is a 43 y.o. female seen in follow-up for incontinence.  She is s/p sling and cystoscopy on 11/07/22.   Has been having a bit more leakage since December. She had three episodes where she had a large loss of urine after getting an urge.  She is wearing period underwear for leakage protection. Does not leak every day. Occasionally has some leakage with stress maybe every other week.   She had previously been on vesicare and myrbetriq. She has been limiting sweet tea intake. She does have one cup of coffee and otherwise abt 32 oz water. She voids every few hours and sometimes wakes up at night.   She had been having more sex recently and has felt things had been more irritated down in the pelvic area and in the bladder after. No burning with urination.    Past Medical History: Patient  has a past medical history of Anxiety, Asthma, Atypical cervical glandular cells, Depression, Family history of breast cancer, Family history of gene mutation, Family history of leukemia, Family history of lung cancer, Family history of pancreatic cancer, Family history of prostate cancer, and GERD (gastroesophageal reflux disease).   Past Surgical History: She  has a past surgical history that includes Tubal ligation (09/23/2006); Abdominal hysterectomy; Bladder suspension (N/A, 11/07/2022); and Cystoscopy (N/A, 11/07/2022).   Medications: She has a current medication list which includes the following prescription(s): acetaminophen, azelastine, estradiol, fluticasone, ibuprofen, ketoconazole, levocetirizine, naproxen, omeprazole, ondansetron, propranolol, and topiramate.   Allergies: Patient is allergic to latex.   Social History: Patient  reports that she has never smoked. She does not have any smokeless tobacco history on file. She reports that she does not currently use alcohol. She reports that she does not use  drugs.     OBJECTIVE     Physical Exam: Vitals:   11/19/23 1132  BP: 128/80  Pulse: 84   Gen: No apparent distress, A&O x 3.  Detailed Urogynecologic Evaluation:  Normal external genitalia. On speculum, normal vaginal mucosa. No visible or palpable mesh.     ASSESSMENT AND PLAN    Karen Kramer is a 43 y.o. with:  1. Overactive bladder    - Based on her symptoms, it seems like she has had an increase in OAB recently.  - We discussed restarting medication (has had some success with Myrbetriq), pelvic PT or third line therapies such as botox, PTNS and SNM.  - She does not feel her symptoms are bad enough to restart medication at this time but would be interested in pelvic PT, referral placed.   Return as needed  Marguerita Beards, MD  Time spent: I spent 20 minutes dedicated to the care of this patient on the date of this encounter to include pre-visit review of records, face-to-face time with the patient and post visit documentation.

## 2023-11-26 ENCOUNTER — Ambulatory Visit: Payer: Medicaid Other | Admitting: Dermatology

## 2023-11-27 ENCOUNTER — Encounter: Payer: Medicaid Other | Admitting: Dermatology

## 2024-01-05 ENCOUNTER — Encounter: Payer: Medicaid Other | Admitting: Dermatology

## 2024-01-28 ENCOUNTER — Ambulatory Visit: Admitting: Dermatology

## 2024-01-28 DIAGNOSIS — L820 Inflamed seborrheic keratosis: Secondary | ICD-10-CM

## 2024-01-28 DIAGNOSIS — Z1283 Encounter for screening for malignant neoplasm of skin: Secondary | ICD-10-CM | POA: Diagnosis not present

## 2024-01-28 DIAGNOSIS — L219 Seborrheic dermatitis, unspecified: Secondary | ICD-10-CM

## 2024-01-28 DIAGNOSIS — L578 Other skin changes due to chronic exposure to nonionizing radiation: Secondary | ICD-10-CM

## 2024-01-28 DIAGNOSIS — D2271 Melanocytic nevi of right lower limb, including hip: Secondary | ICD-10-CM

## 2024-01-28 DIAGNOSIS — L821 Other seborrheic keratosis: Secondary | ICD-10-CM

## 2024-01-28 DIAGNOSIS — L814 Other melanin hyperpigmentation: Secondary | ICD-10-CM

## 2024-01-28 DIAGNOSIS — W908XXA Exposure to other nonionizing radiation, initial encounter: Secondary | ICD-10-CM | POA: Diagnosis not present

## 2024-01-28 DIAGNOSIS — L905 Scar conditions and fibrosis of skin: Secondary | ICD-10-CM

## 2024-01-28 DIAGNOSIS — L813 Cafe au lait spots: Secondary | ICD-10-CM

## 2024-01-28 DIAGNOSIS — D225 Melanocytic nevi of trunk: Secondary | ICD-10-CM

## 2024-01-28 DIAGNOSIS — D229 Melanocytic nevi, unspecified: Secondary | ICD-10-CM

## 2024-01-28 DIAGNOSIS — S0081XA Abrasion of other part of head, initial encounter: Secondary | ICD-10-CM

## 2024-01-28 DIAGNOSIS — D1801 Hemangioma of skin and subcutaneous tissue: Secondary | ICD-10-CM

## 2024-01-28 MED ORDER — KETOCONAZOLE 2 % EX SHAM: MEDICATED_SHAMPOO | CUTANEOUS | 11 refills | Status: AC

## 2024-01-28 NOTE — Patient Instructions (Addendum)

## 2024-01-28 NOTE — Progress Notes (Signed)
 Follow-Up Visit   Subjective  Karen Kramer is a 43 y.o. female who presents for the following: Skin Cancer Screening and Full Body Skin Exam. Spot on mid to lower back to recheck and L shoulder does not get sore, but dry, and scratches it. Patient with history of seb derm, uses ketoconazole  shampoo and has been well-controlled.    The patient presents for Total-Body Skin Exam (TBSE) for skin cancer screening and mole check. The patient has spots, moles and lesions to be evaluated, some may be new or changing and the patient may have concern these could be cancer.   The following portions of the chart were reviewed this encounter and updated as appropriate: medications, allergies, medical history  Review of Systems:  No other skin or systemic complaints except as noted in HPI or Assessment and Plan.  Objective  Well appearing patient in no apparent distress; mood and affect are within normal limits.  A full examination was performed including scalp, head, eyes, ears, nose, lips, neck, chest, axillae, abdomen, back, buttocks, bilateral upper extremities, bilateral lower extremities, hands, feet, fingers, toes, fingernails, and toenails. All findings within normal limits unless otherwise noted below.   Relevant physical exam findings are noted in the Assessment and Plan.  Spinal mid back, R flank Light tan patch at spinal mid back  Tan patch at R flank L shoulder x1 Stuck on waxy papule with erythema  Assessment & Plan   SKIN CANCER SCREENING PERFORMED TODAY.  ACTINIC DAMAGE - Chronic condition, secondary to cumulative UV/sun exposure - diffuse scaly erythematous macules with underlying dyspigmentation - Recommend daily broad spectrum sunscreen SPF 30+ to sun-exposed areas, reapply every 2 hours as needed.  - Staying in the shade or wearing long sleeves, sun glasses (UVA+UVB protection) and wide brim hats (4-inch brim around the entire circumference of the hat) are also  recommended for sun protection.  - Call for new or changing lesions.  LENTIGINES, SEBORRHEIC KERATOSES, HEMANGIOMAS - Benign normal skin lesions - Benign-appearing - Call for any changes  MELANOCYTIC NEVI - Tan-brown and/or pink-flesh-colored symmetric macules and papules -2mm dark brown macule at L lower back  -2mm dark brown macule at R lower calf -2mm dark brown macule at R lateral thigh - Benign appearing on exam today - Observation - Call clinic for new or changing moles - Recommend daily use of broad spectrum spf 30+ sunscreen to sun-exposed areas.    EXCORIATION Exam: pink excoriation at L face  Treatment Plan: Recommend vaseline. Call if not resolving.   SCAR- secondary to childhood injury running into door Exam: Dyspigmented smooth linear patch left temple  Benign-appearing.  Observation.  Call clinic for new or changing lesions. Recommend daily broad spectrum sunscreen SPF 30+, reapply every 2 hours as needed.   SEBORRHEIC DERMATITIS Exam: scalp clear today  Chronic condition with duration or expected duration over one year. Currently well-controlled.   Seborrheic Dermatitis is a chronic persistent rash characterized by pinkness and scaling most commonly of the mid face but also can occur on the scalp (dandruff), ears; mid chest, mid back and groin.  It tends to be exacerbated by stress and cooler weather.  People who have neurologic disease may experience new onset or exacerbation of existing seborrheic dermatitis.  The condition is not curable but treatable and can be controlled.  Treatment Plan: Continue ketoconazole  2% shampoo apply three times per week, massage into scalp and leave in for 5-10 minutes before rinsing out.    CAFE AU LAIT  SPOTS Spinal mid back, R flank Genetic. Benign.  Observation.  Call clinic for new or changing lesions.  Recommend daily use of broad spectrum spf 30+ sunscreen to sun-exposed areas.   SEBORRHEIC DERMATITIS   Related  Medications ketoconazole  (NIZORAL ) 2 % shampoo apply three times per week, massage into scalp and leave in for 10 minutes before rinsing out INFLAMED SEBORRHEIC KERATOSIS L shoulder x1 Symptomatic, irritating, patient would like treated. Destruction of lesion - L shoulder x1  Destruction method: cryotherapy   Informed consent: discussed and consent obtained   Lesion destroyed using liquid nitrogen: Yes   Region frozen until ice ball extended beyond lesion: Yes   Outcome: patient tolerated procedure well with no complications   Post-procedure details: wound care instructions given   Additional details:  Prior to procedure, discussed risks of blister formation, small wound, skin dyspigmentation, or rare scar following cryotherapy. Recommend Vaseline ointment to treated areas while healing.   Return in about 1 year (around 01/27/2025) for w/ Dr. Annette Barters, TBSE, seb derm, mole check.  I, Jacquelynn Vera, CMA, am acting as scribe for Artemio Larry, MD .   Documentation: I have reviewed the above documentation for accuracy and completeness, and I agree with the above.  Artemio Larry, MD   '

## 2024-02-25 ENCOUNTER — Inpatient Hospital Stay: Payer: Medicaid Other | Admitting: Oncology

## 2024-02-25 ENCOUNTER — Inpatient Hospital Stay: Payer: Medicaid Other | Attending: Oncology

## 2024-02-25 NOTE — Therapy (Deleted)
 OUTPATIENT PHYSICAL THERAPY FEMALE PELVIC EVALUATION   Patient Name: Karen Kramer MRN: [id number] DOB:[birth year], 43 y.o., female Today's Date: 02/25/2024  END OF SESSION:   Past Medical History:  Diagnosis Date   Anxiety    Asthma    Atypical cervical glandular cells    Depression    Family history of breast cancer    Family history of gene mutation    Family history of leukemia    Family history of lung cancer    Family history of pancreatic cancer    Family history of prostate cancer    GERD (gastroesophageal reflux disease)    Past Surgical History:  Procedure Laterality Date   ABDOMINAL HYSTERECTOMY     for heavy periods and abnormal paps   BLADDER SUSPENSION N/A 11/07/2022   Procedure: TRANSVAGINAL TAPE (TVT) PROCEDURE;  Surgeon: Arma Lamp, MD;  Location: [facility];  Service: Gynecology;  Laterality: N/A;  Total time requested is 1 hour   CYSTOSCOPY N/A 11/07/2022   Procedure: CYSTOSCOPY;  Surgeon: Arma Lamp, MD;  Location: [facility];  Service: Gynecology;  Laterality: N/A;   TUBAL LIGATION  09/23/2006   Patient Active Problem List   Diagnosis Date Noted   Genetic testing 05/01/2021   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   Family history of gene mutation 04/16/2021   Family history of pancreatic cancer 04/16/2021   Family history of leukemia 04/16/2021   Family history of lung cancer 04/16/2021   Family history of breast cancer 04/16/2021   Family history of prostate cancer 04/16/2021   Seasonal allergic rhinitis due to pollen 06/27/2017   Vitamin D  deficiency 02/20/2016   Overweight 02/20/2016   Gastroesophageal reflux disease without esophagitis 01/25/2016   Mixed incontinence urge and stress 08/24/2015   Anxiety and depression 05/12/2015   Insomnia 05/12/2015   Infection of urinary tract 11/20/2012   Edema 06/11/2012   Complicated UTI (urinary tract infection) 03/31/2012   Left knee pain  11/22/2011   Syncope 05/28/2011   LOW BACK PAIN, CHRONIC 05/17/2009   Disorder of sacrum 05/17/2009   TROCHANTERIC BURSITIS, RIGHT 05/17/2009   Other specified enthesopathies of unspecified lower limb, excluding foot 05/17/2009   Sacrococcygeal disorders, not elsewhere classified 05/17/2009   FATIGUE 03/23/2009   Migraine without aura 04/15/2008   Asthma 04/15/2008   Uncomplicated asthma 04/15/2008   Anxiety 11/22/1991    PCP: Renette Carton, NP  REFERRING PROVIDER: Arma Lamp, MD   REFERRING DIAG: N32.81 (ICD-10-CM) - Overactive bladder   THERAPY DIAG:  No diagnosis found.  Rationale for Evaluation and Treatment: Rehabilitation  ONSET DATE: ***  SUBJECTIVE:  SUBJECTIVE STATEMENT: *** Fluid intake:   PAIN:  Are you having pain? {yes/no:20286} NPRS scale: ***/10 Pain location: {pelvic pain location:27098}  Pain type: {type:313116} Pain description: {PAIN DESCRIPTION:21022940}   Aggravating factors: *** Relieving factors: ***  PRECAUTIONS: {Therapy precautions:24002}  RED FLAGS: {PT Red Flags:29287}   WEIGHT BEARING RESTRICTIONS: No  FALLS:  Has patient fallen in last 6 months? {fallsyesno:27318}  OCCUPATION: ***  ACTIVITY LEVEL : ***  PLOF: {PLOF:24004}  PATIENT GOALS: ***  PERTINENT HISTORY:  s/p sling and cystoscopy on 11/07/22 ; Tubal ligation (09/23/2006);  Abdominal hysterectomy  Sexual abuse: {Yes/No:304960894}  BOWEL MOVEMENT: Pain with bowel movement: {yes/no:20286} Type of bowel movement:{PT BM type:27100} Fully empty rectum: {No/Yes:304960894} Leakage: {Yes/No:304960894} Pads: {Yes/No:304960894} Fiber supplement/laxative {YES/NO AS:20300}  URINATION: Pain with urination: {yes/no:20286} Fully empty bladder: {Yes/No:[id number]}*** Stream:  {PT urination:27102} Urgency: {YES/NO AS:20300} Frequency: *** Leakage: {PT leakage:27103} Pads: {Yes/No:304960894}  INTERCOURSE:  Ability to have vaginal penetration {YES/NO:21197} Pain with intercourse: {pain with intercourse PA:27099} Dryness{YES/NO AS:20300} Climax: *** Marinoff Scale: ***/3 Laxative:  PREGNANCY: Vaginal deliveries *** Tearing {Yes***/No:304960894} Episiotomy {YES/NO AS:20300} C-section deliveries *** Currently pregnant {Yes***/No:304960894}  PROLAPSE: {PT prolapse:27101}   OBJECTIVE:  Note: Objective measures were completed at Evaluation unless otherwise noted.  DIAGNOSTIC FINDINGS:  ***  PATIENT SURVEYS:  {rehab surveys:24030}  PFIQ-7: ***  COGNITION: Overall cognitive status: {cognition:24006}     SENSATION: Light touch: {intact/deficits:24005}  LUMBAR SPECIAL TESTS:  {lumbar special test:25242}  FUNCTIONAL TESTS:  {Functional tests:24029}  GAIT: Assistive device utilized: {Assistive devices:23999} Comments: ***  POSTURE: {posture:25561}   LUMBARAROM/PROM:  A/PROM A/PROM  eval  Flexion   Extension   Right lateral flexion   Left lateral flexion   Right rotation   Left rotation    (Blank rows = not tested)  LOWER EXTREMITY ROM:  {AROM/PROM:27142} ROM Right eval Left eval  Hip flexion    Hip extension    Hip abduction    Hip adduction    Hip internal rotation    Hip external rotation    Knee flexion    Knee extension    Ankle dorsiflexion    Ankle plantarflexion    Ankle inversion    Ankle eversion     (Blank rows = not tested)  LOWER EXTREMITY MMT:  MMT Right eval Left eval  Hip flexion    Hip extension    Hip abduction    Hip adduction    Hip internal rotation    Hip external rotation    Knee flexion    Knee extension    Ankle dorsiflexion    Ankle plantarflexion    Ankle inversion    Ankle eversion     (Blank rows = not tested) PALPATION:   General: ***  Pelvic Alignment:  ***  Abdominal: ***                External Perineal Exam: ***                             Internal Pelvic Floor: ***  Patient confirms identification and approves PT to assess internal pelvic floor and treatment {yes/no:20286}  PELVIC MMT:   MMT eval  Vaginal   Internal Anal Sphincter   External Anal Sphincter   Puborectalis   Diastasis Recti   (Blank rows = not tested)        TONE: ***  PROLAPSE: ***  TODAY'S TREATMENT:  DATE: ***  EVAL ***   PATIENT EDUCATION:  Education details: *** Person educated: {Person educated:25204} Education method: {Education Method:25205} Education comprehension: {Education Comprehension:25206}  HOME EXERCISE PROGRAM: ***  ASSESSMENT:  CLINICAL IMPRESSION: Patient is a *** y.o. *** who was seen today for physical therapy evaluation and treatment for ***.   OBJECTIVE IMPAIRMENTS: {opptimpairments:25111}.   ACTIVITY LIMITATIONS: {activitylimitations:27494}  PARTICIPATION LIMITATIONS: {participationrestrictions:25113}  PERSONAL FACTORS: {Personal factors:25162} are also affecting patient's functional outcome.   REHAB POTENTIAL: {rehabpotential:25112}  CLINICAL DECISION MAKING: {clinical decision making:25114}  EVALUATION COMPLEXITY: {Evaluation complexity:25115}   GOALS: Goals reviewed with patient? {yes/no:20286}  SHORT TERM GOALS: Target date: ***  *** Baseline: Goal status: INITIAL  2.  *** Baseline:  Goal status: INITIAL  3.  *** Baseline:  Goal status: INITIAL  4.  *** Baseline:  Goal status: INITIAL  5.  *** Baseline:  Goal status: INITIAL  6.  *** Baseline:  Goal status: INITIAL  LONG TERM GOALS: Target date: ***  *** Baseline:  Goal status: INITIAL  2.  *** Baseline:  Goal status: INITIAL  3.  *** Baseline:  Goal status: INITIAL  4.  *** Baseline:  Goal  status: INITIAL  5.  *** Baseline:  Goal status: INITIAL  6.  *** Baseline:  Goal status: INITIAL  PLAN:  PT FREQUENCY: {rehab frequency:25116}  PT DURATION: {rehab duration:25117}  PLANNED INTERVENTIONS: {rehab planned interventions:25118::"97110-Therapeutic exercises","97530- Therapeutic [phone]- Neuromuscular re-education","97535- Self JXBJ","47829- Manual therapy"}  PLAN FOR NEXT SESSION: ***   Krina Mraz, PT 02/25/2024, 8:32 AM

## 2024-02-26 ENCOUNTER — Encounter: Admitting: Physical Therapy

## 2024-03-04 ENCOUNTER — Encounter: Admitting: Physical Therapy

## 2024-03-10 NOTE — Therapy (Deleted)
 OUTPATIENT PHYSICAL THERAPY FEMALE PELVIC EVALUATION   Patient Name: Karen Kramer MRN: [id number] DOB:[birth year], 43 y.o., female Today's Date: 03/10/2024  END OF SESSION:   Past Medical History:  Diagnosis Date   Anxiety    Asthma    Atypical cervical glandular cells    Depression    Family history of breast cancer    Family history of gene mutation    Family history of leukemia    Family history of lung cancer    Family history of pancreatic cancer    Family history of prostate cancer    GERD (gastroesophageal reflux disease)    Past Surgical History:  Procedure Laterality Date   ABDOMINAL HYSTERECTOMY     for heavy periods and abnormal paps   BLADDER SUSPENSION N/A 11/07/2022   Procedure: TRANSVAGINAL TAPE (TVT) PROCEDURE;  Surgeon: Arma Lamp, MD;  Location: [facility];  Service: Gynecology;  Laterality: N/A;  Total time requested is 1 hour   CYSTOSCOPY N/A 11/07/2022   Procedure: CYSTOSCOPY;  Surgeon: Arma Lamp, MD;  Location: [facility];  Service: Gynecology;  Laterality: N/A;   TUBAL LIGATION  09/23/2006   Patient Active Problem List   Diagnosis Date Noted   Genetic testing 05/01/2021   Monoallelic mutation of WRN gene 03/47/4259   Family history of gene mutation 04/16/2021   Family history of pancreatic cancer 04/16/2021   Family history of leukemia 04/16/2021   Family history of lung cancer 04/16/2021   Family history of breast cancer 04/16/2021   Family history of prostate cancer 04/16/2021   Seasonal allergic rhinitis due to pollen 06/27/2017   Vitamin D  deficiency 02/20/2016   Overweight 02/20/2016   Gastroesophageal reflux disease without esophagitis 01/25/2016   Mixed incontinence urge and stress 08/24/2015   Anxiety and depression 05/12/2015   Insomnia 05/12/2015   Infection of urinary tract 11/20/2012   Edema 06/11/2012   Complicated UTI (urinary tract infection) 03/31/2012   Left knee pain  11/22/2011   Syncope 05/28/2011   LOW BACK PAIN, CHRONIC 05/17/2009   Disorder of sacrum 05/17/2009   TROCHANTERIC BURSITIS, RIGHT 05/17/2009   Other specified enthesopathies of unspecified lower limb, excluding foot 05/17/2009   Sacrococcygeal disorders, not elsewhere classified 05/17/2009   FATIGUE 03/23/2009   Migraine without aura 04/15/2008   Asthma 04/15/2008   Uncomplicated asthma 04/15/2008   Anxiety 11/22/1991    PCP: Renette Carton, NP  REFERRING PROVIDER: Arma Lamp, MD   REFERRING DIAG: N32.81 (ICD-10-CM) - Overactive bladder   THERAPY DIAG:  No diagnosis found.  Rationale for Evaluation and Treatment: Rehabilitation  ONSET DATE: ***  SUBJECTIVE:  SUBJECTIVE STATEMENT: *** Fluid intake:   PAIN:  Are you having pain? {yes/no:20286} NPRS scale: ***/10 Pain location: {pelvic pain location:27098}  Pain type: {type:313116} Pain description: {PAIN DESCRIPTION:21022940}   Aggravating factors: *** Relieving factors: ***  PRECAUTIONS: {Therapy precautions:24002}  RED FLAGS: {PT Red Flags:29287}   WEIGHT BEARING RESTRICTIONS: {Yes ***/No:24003}  FALLS:  Has patient fallen in last 6 months? {fallsyesno:27318}  OCCUPATION: ***  ACTIVITY LEVEL : ***  PLOF: {PLOF:24004}  PATIENT GOALS: ***  PERTINENT HISTORY:  s/p sling and cystoscopy on 11/07/22.; Abdominal hysterectomy;  Sexual abuse: {Yes/No:304960894}  BOWEL MOVEMENT: Pain with bowel movement: {yes/no:20286} Type of bowel movement:{PT BM type:27100} Fully empty rectum: {No/Yes:304960894} Leakage: {Yes/No:304960894} Pads: {Yes/No:304960894} Fiber supplement/laxative {YES/NO AS:20300}  URINATION: Pain with urination: {yes/no:20286} Fully empty bladder: {Yes/No:304960894}*** Stream: {PT  urination:27102} Urgency: {YES/NO AS:20300} Frequency: *** Leakage: {PT leakage:27103} Pads: {Yes/No:304960894}  INTERCOURSE:  Ability to have vaginal penetration {YES/NO:21197} Pain with intercourse: {pain with intercourse PA:27099} Dryness{YES/NO AS:20300} Climax: *** Marinoff Scale: ***/3 Laxative:  PREGNANCY: Vaginal deliveries *** Tearing {Yes***/No:304960894} Episiotomy {YES/NO AS:20300} C-section deliveries *** Currently pregnant {Yes***/No:304960894}  PROLAPSE: {PT prolapse:27101}   OBJECTIVE:  Note: Objective measures were completed at Evaluation unless otherwise noted.  DIAGNOSTIC FINDINGS:  ***  PATIENT SURVEYS:  {rehab surveys:24030}  PFIQ-7: ***  COGNITION: Overall cognitive status: {cognition:24006}     SENSATION: Light touch: {intact/deficits:24005}  LUMBAR SPECIAL TESTS:  {lumbar special test:25242}  FUNCTIONAL TESTS:  {Functional tests:24029}  GAIT: Assistive device utilized: {Assistive devices:23999} Comments: ***  POSTURE: {posture:25561}   LUMBARAROM/PROM:  A/PROM A/PROM  eval  Flexion   Extension   Right lateral flexion   Left lateral flexion   Right rotation   Left rotation    (Blank rows = not tested)  LOWER EXTREMITY ROM:  {AROM/PROM:27142} ROM Right eval Left eval  Hip flexion    Hip extension    Hip abduction    Hip adduction    Hip internal rotation    Hip external rotation    Knee flexion    Knee extension    Ankle dorsiflexion    Ankle plantarflexion    Ankle inversion    Ankle eversion     (Blank rows = not tested)  LOWER EXTREMITY MMT:  MMT Right eval Left eval  Hip flexion    Hip extension    Hip abduction    Hip adduction    Hip internal rotation    Hip external rotation    Knee flexion    Knee extension    Ankle dorsiflexion    Ankle plantarflexion    Ankle inversion    Ankle eversion     (Blank rows = not tested) PALPATION:   General: ***  Pelvic Alignment: ***  Abdominal:  ***                External Perineal Exam: ***                             Internal Pelvic Floor: ***  Patient confirms identification and approves PT to assess internal pelvic floor and treatment {yes/no:20286}  PELVIC MMT:   MMT eval  Vaginal   Internal Anal Sphincter   External Anal Sphincter   Puborectalis   Diastasis Recti   (Blank rows = not tested)        TONE: ***  PROLAPSE: ***  TODAY'S TREATMENT:  DATE: ***  EVAL ***   PATIENT EDUCATION:  Education details: *** Person educated: {Person educated:25204} Education method: {Education Method:25205} Education comprehension: {Education Comprehension:25206}  HOME EXERCISE PROGRAM: ***  ASSESSMENT:  CLINICAL IMPRESSION: Patient is a *** y.o. *** who was seen today for physical therapy evaluation and treatment for ***.   OBJECTIVE IMPAIRMENTS: {opptimpairments:25111}.   ACTIVITY LIMITATIONS: {activitylimitations:27494}  PARTICIPATION LIMITATIONS: {participationrestrictions:25113}  PERSONAL FACTORS: {Personal factors:25162} are also affecting patient's functional outcome.   REHAB POTENTIAL: {rehabpotential:25112}  CLINICAL DECISION MAKING: {clinical decision making:25114}  EVALUATION COMPLEXITY: {Evaluation complexity:25115}   GOALS: Goals reviewed with patient? {yes/no:20286}  SHORT TERM GOALS: Target date: ***  *** Baseline: Goal status: INITIAL  2.  *** Baseline:  Goal status: INITIAL  3.  *** Baseline:  Goal status: INITIAL  4.  *** Baseline:  Goal status: INITIAL  5.  *** Baseline:  Goal status: INITIAL  6.  *** Baseline:  Goal status: INITIAL  LONG TERM GOALS: Target date: ***  *** Baseline:  Goal status: INITIAL  2.  *** Baseline:  Goal status: INITIAL  3.  *** Baseline:  Goal status: INITIAL  4.  *** Baseline:  Goal status:  INITIAL  5.  *** Baseline:  Goal status: INITIAL  6.  *** Baseline:  Goal status: INITIAL  PLAN:  PT FREQUENCY: {rehab frequency:25116}  PT DURATION: {rehab duration:25117}  PLANNED INTERVENTIONS: {rehab planned interventions:25118::97110-Therapeutic exercises,97530- Therapeutic [id number]- Neuromuscular re-education,97535- Self FAOZ,30865- Manual therapy}  PLAN FOR NEXT SESSION: ***   Devany Aja, PT 03/10/2024, 2:58 PM

## 2024-03-11 ENCOUNTER — Encounter: Admitting: Physical Therapy

## 2024-03-25 ENCOUNTER — Encounter: Admitting: Physical Therapy

## 2024-05-07 ENCOUNTER — Encounter: Payer: Self-pay | Admitting: Family Medicine

## 2024-05-07 DIAGNOSIS — Z1231 Encounter for screening mammogram for malignant neoplasm of breast: Secondary | ICD-10-CM

## 2024-05-11 ENCOUNTER — Other Ambulatory Visit: Payer: Self-pay | Admitting: Family Medicine

## 2024-05-11 DIAGNOSIS — N644 Mastodynia: Secondary | ICD-10-CM

## 2024-05-12 ENCOUNTER — Other Ambulatory Visit: Payer: Self-pay | Admitting: Family

## 2024-05-12 DIAGNOSIS — Z1231 Encounter for screening mammogram for malignant neoplasm of breast: Secondary | ICD-10-CM

## 2024-05-12 DIAGNOSIS — N644 Mastodynia: Secondary | ICD-10-CM

## 2024-05-12 DIAGNOSIS — N6342 Unspecified lump in left breast, subareolar: Secondary | ICD-10-CM

## 2024-05-13 ENCOUNTER — Ambulatory Visit
Admission: RE | Admit: 2024-05-13 | Discharge: 2024-05-13 | Disposition: A | Discharge status: Home / Self Care | Source: Ambulatory Visit | Attending: Family | Admitting: Family

## 2024-05-13 ENCOUNTER — Inpatient Hospital Stay
Admission: RE | Admit: 2024-05-13 | Discharge: 2024-05-13 | Discharge status: Home / Self Care | Source: Ambulatory Visit | Attending: Family | Admitting: Family

## 2024-05-13 DIAGNOSIS — N6342 Unspecified lump in left breast, subareolar: Secondary | ICD-10-CM

## 2024-05-13 DIAGNOSIS — Z1231 Encounter for screening mammogram for malignant neoplasm of breast: Secondary | ICD-10-CM | POA: Insufficient documentation

## 2024-05-13 DIAGNOSIS — N644 Mastodynia: Secondary | ICD-10-CM | POA: Insufficient documentation

## 2024-10-04 ENCOUNTER — Encounter: Payer: Self-pay | Admitting: *Deleted

## 2025-02-08 ENCOUNTER — Encounter: Admitting: Dermatology
